# Patient Record
Sex: Female | Born: 1960 | Race: White | Hispanic: No | Smoking: Never smoker
Health system: Southern US, Community
[De-identification: ages and names within clinical notes are randomized; demographics above are authoritative.]

## PROBLEM LIST (undated history)

## (undated) DIAGNOSIS — K219 Gastro-esophageal reflux disease without esophagitis: Principal | ICD-10-CM

## (undated) DIAGNOSIS — F419 Anxiety disorder, unspecified: Secondary | ICD-10-CM

## (undated) DIAGNOSIS — F329 Major depressive disorder, single episode, unspecified: Secondary | ICD-10-CM

## (undated) DIAGNOSIS — L989 Disorder of the skin and subcutaneous tissue, unspecified: Secondary | ICD-10-CM

## (undated) DIAGNOSIS — K5790 Diverticulosis of intestine, part unspecified, without perforation or abscess without bleeding: Secondary | ICD-10-CM

## (undated) DIAGNOSIS — R197 Diarrhea, unspecified: Secondary | ICD-10-CM

## (undated) DIAGNOSIS — M1990 Unspecified osteoarthritis, unspecified site: Secondary | ICD-10-CM

## (undated) DIAGNOSIS — R030 Elevated blood-pressure reading, without diagnosis of hypertension: Secondary | ICD-10-CM

## (undated) DIAGNOSIS — F32A Depression, unspecified: Secondary | ICD-10-CM

## (undated) DIAGNOSIS — E663 Overweight: Secondary | ICD-10-CM

## (undated) DIAGNOSIS — F4329 Adjustment disorder with other symptoms: Secondary | ICD-10-CM

## (undated) DIAGNOSIS — E039 Hypothyroidism, unspecified: Principal | ICD-10-CM

## (undated) DIAGNOSIS — B019 Varicella without complication: Secondary | ICD-10-CM

## (undated) DIAGNOSIS — B354 Tinea corporis: Secondary | ICD-10-CM

## (undated) HISTORY — DX: Depression, unspecified: F32.A

## (undated) HISTORY — DX: Elevated blood-pressure reading, without diagnosis of hypertension: R03.0

## (undated) HISTORY — DX: Unspecified osteoarthritis, unspecified site: M19.90

## (undated) HISTORY — DX: Disorder of the skin and subcutaneous tissue, unspecified: L98.9

## (undated) HISTORY — DX: Hypothyroidism, unspecified: E03.9

## (undated) HISTORY — DX: Adjustment disorder with other symptoms: F43.29

## (undated) HISTORY — DX: Tinea corporis: B35.4

## (undated) HISTORY — DX: Gastro-esophageal reflux disease without esophagitis: K21.9

## (undated) HISTORY — DX: Varicella without complication: B01.9

## (undated) HISTORY — DX: Major depressive disorder, single episode, unspecified: F32.9

## (undated) HISTORY — DX: Anxiety disorder, unspecified: F41.9

## (undated) HISTORY — DX: Diverticulosis of intestine, part unspecified, without perforation or abscess without bleeding: K57.90

## (undated) HISTORY — DX: Diarrhea, unspecified: R19.7

## (undated) HISTORY — DX: Overweight: E66.3

---
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
## 1979-10-22 HISTORY — PX: OTHER SURGICAL HISTORY: SHX169

## 1985-10-21 HISTORY — PX: GANGLION CYST EXCISION: SHX1691

## 2000-02-13 ENCOUNTER — Other Ambulatory Visit: Admission: RE | Admit: 2000-02-13 | Discharge: 2000-02-13 | Payer: Self-pay | Admitting: Obstetrics & Gynecology

## 2000-10-21 HISTORY — PX: ABDOMINAL HYSTERECTOMY: SHX81

## 2001-02-09 ENCOUNTER — Encounter (INDEPENDENT_AMBULATORY_CARE_PROVIDER_SITE_OTHER): Payer: Self-pay | Admitting: Specialist

## 2001-02-09 ENCOUNTER — Inpatient Hospital Stay (HOSPITAL_COMMUNITY): Admission: RE | Admit: 2001-02-09 | Discharge: 2001-02-11 | Payer: Self-pay | Admitting: Obstetrics & Gynecology

## 2002-09-10 ENCOUNTER — Encounter: Payer: Self-pay | Admitting: Cardiology

## 2002-10-21 HISTORY — PX: BUNIONECTOMY: SHX129

## 2005-05-13 ENCOUNTER — Encounter: Admission: RE | Admit: 2005-05-13 | Discharge: 2005-05-13 | Payer: Self-pay | Admitting: Nurse Practitioner

## 2005-05-19 ENCOUNTER — Emergency Department (HOSPITAL_COMMUNITY): Admission: EM | Admit: 2005-05-19 | Discharge: 2005-05-19 | Payer: Self-pay | Admitting: Emergency Medicine

## 2009-10-11 ENCOUNTER — Encounter (INDEPENDENT_AMBULATORY_CARE_PROVIDER_SITE_OTHER): Payer: Self-pay | Admitting: *Deleted

## 2009-10-11 LAB — CONVERTED CEMR LAB
BUN: 14 mg/dL
CO2: 29 meq/L
Calcium: 9.4 mg/dL
Chloride: 103 meq/L
Cholesterol: 199 mg/dL
Glucose, Bld: 82 mg/dL
Potassium: 4.9 meq/L
Sodium: 19 meq/L

## 2010-01-05 ENCOUNTER — Encounter: Payer: Self-pay | Admitting: Family Medicine

## 2010-02-16 LAB — HM MAMMOGRAPHY: HM Mammogram: NEGATIVE

## 2010-09-26 ENCOUNTER — Ambulatory Visit: Payer: Self-pay | Admitting: Family Medicine

## 2010-09-26 ENCOUNTER — Telehealth: Payer: Self-pay | Admitting: Family Medicine

## 2010-09-26 DIAGNOSIS — F411 Generalized anxiety disorder: Secondary | ICD-10-CM | POA: Insufficient documentation

## 2010-09-26 DIAGNOSIS — M545 Low back pain, unspecified: Secondary | ICD-10-CM | POA: Insufficient documentation

## 2010-09-26 DIAGNOSIS — N920 Excessive and frequent menstruation with regular cycle: Secondary | ICD-10-CM | POA: Insufficient documentation

## 2010-09-26 DIAGNOSIS — M1990 Unspecified osteoarthritis, unspecified site: Secondary | ICD-10-CM | POA: Insufficient documentation

## 2010-09-28 ENCOUNTER — Telehealth: Payer: Self-pay | Admitting: Family Medicine

## 2010-10-03 ENCOUNTER — Ambulatory Visit: Payer: Self-pay | Admitting: Family Medicine

## 2010-10-03 LAB — CONVERTED CEMR LAB
ALT: 26 units/L (ref 0–35)
AST: 33 units/L (ref 0–37)
Alkaline Phosphatase: 48 units/L (ref 39–117)
Basophils Absolute: 0 10*3/uL (ref 0.0–0.1)
Basophils Relative: 0.7 % (ref 0.0–3.0)
Bilirubin Urine: NEGATIVE
Bilirubin, Direct: 0.1 mg/dL (ref 0.0–0.3)
Calcium: 9.5 mg/dL (ref 8.4–10.5)
Creatinine, Ser: 0.6 mg/dL (ref 0.4–1.2)
Eosinophils Relative: 2.3 % (ref 0.0–5.0)
Lymphocytes Relative: 32.3 % (ref 12.0–46.0)
Lymphs Abs: 1.5 10*3/uL (ref 0.7–4.0)
MCV: 95.1 fL (ref 78.0–100.0)
Monocytes Relative: 9.9 % (ref 3.0–12.0)
Neutro Abs: 2.6 10*3/uL (ref 1.4–7.7)
Nitrite: NEGATIVE
Potassium: 4.8 meq/L (ref 3.5–5.1)
Specific Gravity, Urine: 1.015 (ref 1.000–1.030)
Total Bilirubin: 0.5 mg/dL (ref 0.3–1.2)
Total Protein: 7 g/dL (ref 6.0–8.3)
Triglycerides: 95 mg/dL (ref 0.0–149.0)
Urine Glucose: NEGATIVE mg/dL
Urobilinogen, UA: 0.2 (ref 0.0–1.0)
WBC: 4.7 10*3/uL (ref 4.5–10.5)

## 2010-10-08 ENCOUNTER — Encounter (INDEPENDENT_AMBULATORY_CARE_PROVIDER_SITE_OTHER): Payer: Self-pay | Admitting: *Deleted

## 2010-11-14 ENCOUNTER — Encounter: Payer: Self-pay | Admitting: Family Medicine

## 2010-11-20 NOTE — Progress Notes (Signed)
  Phone Note Call from Patient   Call For: other MDs and additional FH Summary of Call: Patient called to report Her Dermatologist is Dr Lenard Forth of [facility], [address], [phone] Her Cardiologist is Dr Cassell Clement of [facility], [address] [place], [phone] Mother: HTN Father: Hyperlipidemia Initial call taken by: Danise Edge MD,  September 28, 2010 10:58 AM

## 2010-11-20 NOTE — Progress Notes (Signed)
Summary: Medical Record Request  Phone Note Outgoing Call   Call placed by: Lannette Donath,  September 26, 2010 5:09 PM Summary of Call: Faxed medical record request to [facility] [phone] Initial call taken by: Lannette Donath,  September 26, 2010 5:11 PM
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
## 2010-11-22 NOTE — Miscellaneous (Signed)
Summary: Lab and test results  Clinical Lists Changes  Observations: Added new observation of MAMMRECACT: Screening mammogram in 1 year.    (02/16/2010 15:22) Added new observation of MAMMOGRAM:  Assessment: Negative-BIRADS 1.  (02/16/2010 15:22) Added new observation of BILI TOTAL: 0.7 mg/dL (16/07/9603 54:09) Added new observation of SGOT (AST): 28 units/L (10/11/2009 15:07) Added new observation of SGPT (ALT): 21 units/L (10/11/2009 15:07) Added new observation of CALCIUM: 9.4 mg/dL (81/19/1478 29:56) Added new observation of CO2 PLSM/SER: 29 meq/L (10/11/2009 15:06) Added new observation of CL SERUM: 103 meq/L (10/11/2009 15:06) Added new observation of K SERUM: 4.9 meq/L (10/11/2009 15:06) Added new observation of NA: 19 meq/L (10/11/2009 15:06) Added new observation of CREATININE: 0.70 mg/dL (21/30/8657 84:69) Added new observation of BUN: 14 mg/dL (62/95/2841 32:44) Added new observation of BG RANDOM: 82 mg/dL (10/23/7251 66:44) Added new observation of TRIGLYCERIDE: 145 mg/dL (03/47/4259 56:38) Added new observation of LDL DIR: 88 mg/dL (75/64/3329 51:88) Added new observation of CHOLESTEROL: 199 mg/dL (41/66/0630 16:01) Added new observation of MAMMRECACT: Screening mammogram in 1 year.     (02/10/2009 15:11) Added new observation of MAMMOGRAM:  Assessment: Negative-BIRADS 1.   (02/10/2009 15:11) Added new observation of MAMMRECACT: Screening mammogram in 1 year.     (02/10/2008 15:19) Added new observation of MAMMOGRAM:  Assessment:Negative- BIRADS 1.   (02/10/2008 15:19) Added new observation of MAMMRECACT: Screening mammogram in 1 year.     (12/22/2006 15:20) Added new observation of MAMMOGRAM:  Assessment: Negative-BIRADS 1.   (12/22/2006 15:20) Added new observation of MAMMRECACT: Screening mammogram in 1 year.     (04/12/2005 15:20) Added new observation of MAMMOGRAM:  Assessment:Negative- BIRADS 1.   (04/12/2005 15:20) Added new observation of MAMMRECACT:  Screening mammogram in 1 year.     (04/02/2004 15:20) Added new observation of MAMMOGRAM:  Assessment:Negative- BIRADS 1.   (04/02/2004 15:20)      Mammogram  Procedure date:  02/10/2009  Findings:       Assessment: Negative-BIRADS 1.   Mammogram  Procedure date:  02/10/2009  Comments:      Screening mammogram in 1 year.     Mammogram  Procedure date:  02/10/2008  Findings:       Assessment:Negative- BIRADS 1.   Comments:      Screening mammogram in 1 year.     Mammogram  Procedure date:  12/22/2006  Findings:       Assessment: Negative-BIRADS 1.   Comments:      Screening mammogram in 1 year.     Mammogram  Procedure date:  04/12/2005  Findings:       Assessment:Negative- BIRADS 1.   Comments:      Screening mammogram in 1 year.     Mammogram  Procedure date:  04/02/2004  Findings:       Assessment:Negative- BIRADS 1.   Comments:      Screening mammogram in 1 year.     Mammogram  Procedure date:  02/16/2010  Findings:       Assessment: Negative-BIRADS 1.   Comments:      Screening mammogram in 1 year.     -  Date:  10/11/2009    LDL-direct: 88    Triglycerides: 145

## 2010-11-22 NOTE — Letter (Signed)
Summary: 2009-01/05/10 records  2009-01/05/10 records   Imported By: Lester Stewart 10/16/2010 08:13:36  _____________________________________________________________________  External Attachment:    Type:   Image     Comment:   External Document

## 2011-01-01 ENCOUNTER — Ambulatory Visit (INDEPENDENT_AMBULATORY_CARE_PROVIDER_SITE_OTHER): Payer: BC Managed Care – PPO | Admitting: Family Medicine

## 2011-01-01 ENCOUNTER — Encounter: Payer: Self-pay | Admitting: Family Medicine

## 2011-01-01 DIAGNOSIS — R3911 Hesitancy of micturition: Secondary | ICD-10-CM | POA: Insufficient documentation

## 2011-01-01 DIAGNOSIS — M25559 Pain in unspecified hip: Secondary | ICD-10-CM | POA: Insufficient documentation

## 2011-01-01 DIAGNOSIS — M545 Low back pain, unspecified: Secondary | ICD-10-CM

## 2011-01-02 ENCOUNTER — Ambulatory Visit (HOSPITAL_BASED_OUTPATIENT_CLINIC_OR_DEPARTMENT_OTHER)
Admission: RE | Admit: 2011-01-02 | Discharge: 2011-01-02 | Disposition: A | Discharge status: Home / Self Care | Payer: BC Managed Care – PPO | Source: Ambulatory Visit | Attending: Family Medicine | Admitting: Family Medicine

## 2011-01-02 ENCOUNTER — Encounter: Payer: Self-pay | Admitting: Family Medicine

## 2011-01-02 ENCOUNTER — Other Ambulatory Visit: Payer: Self-pay | Admitting: Family Medicine

## 2011-01-02 DIAGNOSIS — M1990 Unspecified osteoarthritis, unspecified site: Secondary | ICD-10-CM

## 2011-01-02 DIAGNOSIS — M169 Osteoarthritis of hip, unspecified: Secondary | ICD-10-CM | POA: Insufficient documentation

## 2011-01-02 DIAGNOSIS — M545 Low back pain, unspecified: Secondary | ICD-10-CM | POA: Insufficient documentation

## 2011-01-02 DIAGNOSIS — M1610 Unilateral primary osteoarthritis, unspecified hip: Secondary | ICD-10-CM | POA: Insufficient documentation

## 2011-01-03 ENCOUNTER — Telehealth (INDEPENDENT_AMBULATORY_CARE_PROVIDER_SITE_OTHER): Payer: Self-pay | Admitting: *Deleted

## 2011-01-08 NOTE — Progress Notes (Signed)
Summary: Urine test results  Phone Note Call from Patient Call back at [phone]   Caller: Patient Reason for Call: Talk to Nurse Summary of Call: pt checking on urine results, best time to reach her will be after lunch on Friday 01/04/11 Initial call taken by: Lannette Donath,  January 03, 2011 4:14 PM  Follow-up for Phone Call        So for some reason they reinculbated her urine culture instead of resulting it, I guess it did not culture correctly the first time. Please check with lab to see if we might expect some results today or not. THe patient is not available to talk until afternoon, so when we do talk to her see what her symptoms are because we may just have to treat her empirically since we are approaching the weekend Follow-up by: Danise Edge MD,  January 04, 2011 9:09 AM  Additional Follow-up for Phone Call Additional follow up Details #1::        Loney Loh states the preliminary comes out usually 24 hrs after receiving specimen and the final is usually 48 hrs after receiving specimen. So it should be back sometime today. Additional Follow-up by: Josph Macho RMA,  January 04, 2011 10:30 AM    Additional Follow-up for Phone Call Additional follow up Details #2::    will inform pt with other note Follow-up by: Josph Macho RMA,  January 04, 2011 10:34 AM
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
## 2011-03-08 NOTE — Discharge Summary (Signed)
[facility]  Patient:    Kristen Cooke, Kristen Cooke                       MRN: [id number] Adm. Date:  [id number] Disc. Date: 02/11/01 Attending:  Lars Pinks                           Discharge Summary  FINAL DIAGNOSES:              Menorrhagia, dysmenorrhea.  SECONDARY DIAGNOSES:          None.  PROCEDURE:                    Diagnostic laparoscopy, transvaginal hysterectomy.  COMPLICATIONS:                None.  CONDITION ON DISCHARGE:       Improved.                                This is a 50 year old nulligravida female who is complaining of heavy menstrual periods for approximately four years.  The patient has been tried on oral contraceptives with limited success and she complains of even on oral contraceptives her periods are heavy and prolonged. She had a sonohysterogram done that showed no evidence of submucous myoma or polyps.  It did show some thickened irregularity of the myometrium which might have represented adenomyosis.  In view of the failure of conservative therapy to control this patients menorrhagia which has caused her problems with work and social functioning, the decision was made to proceed with hysterectomy. The patient was brought to the operating room on the day of admission where diagnostic laparoscopy was performed to rule out endometriosis or significant pelvic adhesions.  There were some filmy adhesions around the right ovary to the posterior fundus.  Otherwise, the pelvis appeared normal.  There was also a small area of what could be endometriosis on the right uterosacral ligament. Following the diagnostic laparoscopy and lysis of adhesions, the transvaginal hysterectomy was then carried out without complication.  The patients postoperative course was benign without significant fever or anemia. Postoperative day #1 her hemoglobin was 11.3, white count 11,000.  She was doing well.  The patient did not feel ready for  discharge on the first postoperative day.  On the second postoperative day the patient was feeling somewhat stronger.  She was voiding without difficulty, passing gas, and eating well.  Her pain was well controlled with oral analgesia.  The patient was therefore felt to be ready for discharge.  She was discharged on a regular diet, told to limit her activity.  She was given Tylox #30 tablets to take one q.4h. p.r.n. pain.  She was given Restoril 30 mg to take at bedtime to help with sleep.  Told to get over-the-counter iron preparation.  She was also asked to call the office in three weeks for followup evaluation.  LABORATORIES:                 Hemoglobin on admission was 13.9, white count 6300.  Postoperative hemoglobin was 11 with a white count of 11,000.  Routine chemistries done preoperative were all normal.  Urine pregnancy test was negative.  Urinalysis was benign.  PATHOLOGY:  Pending at the time of this dictation. DD:  02/11/01 TD:  02/11/01 Job: [id number]

## 2011-03-08 NOTE — H&P (Signed)
[facility]  Patient:    Kristen Cooke, Kristen Cooke                         MRN: [id number] Adm. Date:  02/09/01 Attending:  Caralyn Guile. Arlyce Dice, M.D.                         History and Physical  CHIEF COMPLAINT:              Menorrhagia and dysmenorrhea.  HISTORY OF PRESENT ILLNESS:   This is a 50 year old nulligravida female who has had heavy menstrual periods for approximately four years.  The patient has been tried on oral contraceptives with only limited success, and she complains that even on oral contraceptives, her periods are heavy and prolonged.  The patient had a sonohistogram done, and this showed no evidence of submucous myoma or polyps.  The options of endometrial ablation or total vaginal hysterectomy were discussed with the patient, and the patient opted for transvaginal hysterectomy.  PAST MEDICAL HISTORY:         The patient has had previous surgery which included repair of a broken leg which required a pin insertion and removal. She has no medical problems.  ALLERGIES:                    SULFA.  MEDICATIONS:                  Zoloft taken for premenstrual syndrome.  FAMILY HISTORY:               She has an aunt with diabetes.  She has a maternal grandmother who developed breast cancer in her 63s.  Otherwise, her family history is negative for heart disease or high blood pressure.  REVIEW OF SYSTEMS:            Negative in detail.  PHYSICAL EXAMINATION:  VITAL SIGNS:                  She is 5 feet 3-1/2 inches tall, 125 pounds.  Bp 110/60, and her other vital signs are normal.  NECK:                         Supple without thyromegaly.  BREASTS:                      Without masses.  HEART:                        Without murmur or gallop.  CHEST:                        Clear to auscultation and percussion.  ABDOMEN:                      Soft without tenderness.  EXTREMITIES:                  Appear normal, and she has full range of  motion in the leg on which she had surgery.  LYMPHADENOPATHY:              None noted.  GU:                           External genitalia were normal,  vagina was normal.  Her cervix appeared clean and normal.  Her uterus was mobile, anterior, normal size, shape, and contour.  There were no adnexal masses.  IMPRESSION:                   This patient is suffering from dysmenorrhea and menorrhagia which has not responded to oral contraceptives or the nonsteroidal anti-inflammatory drugs.  The patient considered endometrial ablation and has opted for a transvaginal hysterectomy.  The risks of this procedure were discussed with the patient prior to proceeding.  HOSPITAL PLAN:                Because the patient is nulliparous and because of the dysmenorrhea, a diagnostic laparoscopy will be performed prior to surgery to ensure that there are no adhesions and no significant endometriosis.  Following this, a transvaginal hysterectomy will be performed. D:  02/08/01 TD:  02/08/01 Job: [id number]

## 2011-03-28 ENCOUNTER — Other Ambulatory Visit: Payer: Self-pay | Admitting: Family Medicine

## 2011-04-05 ENCOUNTER — Ambulatory Visit (INDEPENDENT_AMBULATORY_CARE_PROVIDER_SITE_OTHER): Payer: BC Managed Care – PPO | Admitting: Family Medicine

## 2011-04-05 ENCOUNTER — Encounter: Payer: Self-pay | Admitting: Family Medicine

## 2011-04-05 VITALS — BP 130/84 | HR 68 | Temp 98.4°F | Ht 64.25 in | Wt 141.8 lb

## 2011-04-05 DIAGNOSIS — L0291 Cutaneous abscess, unspecified: Secondary | ICD-10-CM

## 2011-04-05 DIAGNOSIS — S93409A Sprain of unspecified ligament of unspecified ankle, initial encounter: Secondary | ICD-10-CM

## 2011-04-05 DIAGNOSIS — S93401A Sprain of unspecified ligament of right ankle, initial encounter: Secondary | ICD-10-CM | POA: Insufficient documentation

## 2011-04-05 DIAGNOSIS — L0390 Cellulitis, unspecified: Secondary | ICD-10-CM

## 2011-04-05 MED ORDER — DOXYCYCLINE HYCLATE 100 MG PO TABS: 100.0000 mg | ORAL_TABLET | Freq: Two times a day (BID) | ORAL | Status: AC

## 2011-04-05 NOTE — Assessment & Plan Note (Signed)
Patient notes pain over a lesion over her central lumbar spine, she thinks she got it yesterday while working outside but is being seen today because it is now painful. Lesion is ecchymotic and serythematous surrounding a punctate scab. Suspect early infection of a bug bite, will rx Doxycycline 100mg  po bid x 7 days and patient will cleanse with [facility] as directed. Call with any concerns

## 2011-04-05 NOTE — Assessment & Plan Note (Signed)
Patient suffered an inversion injury to her right ankle about a month ago and while it is improved it does continue to hurt some with mild swelling over the lateral malleolus. She is instructed to ice the lesion and then apply Aspercreme bid and then to perform some exercises as instructed, call if symptoms worsen

## 2011-04-05 NOTE — Patient Instructions (Signed)
Cellulitis Cellulitis is an infection of the skin and the tissue beneath it. The area is typically red and tender. It is caused by germs (bacteria) (usually staph or strep) that enter the body through cuts or sores. Cellulitis most commonly occurs in the arms or lower legs.  HOME CARE INSTRUCTIONS  If you are given a prescription for medications which kill germs (antibiotics), take as directed until finished.   If the infection is on the arm or leg, keep the limb elevated as able.   Use a warm cloth several times per day to relieve pain and encourage healing.   See your caregiver for recheck of the infected site in  or sooner if problems arise.   Only take over-the-counter or prescription medicines for pain, discomfort, or fever as directed by your caregiver.  SEEK MEDICAL CARE IF:  An oral temperature above 104 develops, not controlled by medication.   The area of redness (inflammation) is spreading, there are red streaks coming from the infected site, or if a part of the infection begins to turn dark in color.   The joint or bone underneath the infected skin becomes painful after the skin has healed.   The infection returns in the same or another area after it seems to have gone away.   A boil or bump swells up. This may be an abscess.   New, unexplained problems such as pain or fever develop.  SEEK IMMEDIATE MEDICAL CARE IF:  You or your child feels drowsy or lethargic.   There is vomiting, diarrhea, or lasting discomfort or feeling ill (malaise) with muscle aches and pains.  MAKE SURE YOU:   Understand these instructions.   Will watch your condition.   Will get help right away if you are not doing well or get worse.  Document Released: 07/17/2005 Document Re-Released: 08/04/2009 [facility] Patient Information 2011 [place], [place].  Remember to cleanse area twice daily with alcohol, hydrogen peroxide and/or witch hazel peroxide  For the ankle ice twice daily, apply  Aspercreme twice daily as needed and do the ABCs

## 2011-04-05 NOTE — Progress Notes (Signed)
Kristen Cooke [id number] [birth year] 04/05/2011      Progress Note-Follow Up  Subjective  Chief Complaint  Chief Complaint  Patient presents with  . Insect Bite    X 1 day- on back    HPI  Patient is in today for evaluation of a sore on her lower back, she was working outside yesterday and thinks she suffered a bite on her lower back, she did not see an insect. This morning the lesion is bothering her some with some pain developing so she is in to have it evaluated. No fevers/chills/malaise/myalgias/nausea/anorexia/GI or GU c/o. She has not taken any meds for the condition. She also notes about a month ago she suffered an inversion injury to her right ankle and has been struggling with some discomfort ever since, it is improving slowly. The swelling and tenderness surrounds the lateral malleolus. No numbness or weakness.   Past Medical History  Diagnosis Date  . Chicken pox   . Depression   . Anxiety   . Arthritis     back    Past Surgical History  Procedure Date  . Abdominal hysterectomy 2002    vaginal, partial  . Tonsillectomy 1967  . Titanium rod insertion 1978    right femur  . Titanium rod removal 1980    bone graph, re-insertion  . Titanium rod removal 1981  . Ganglion cyst excision 1987    right wrist  . Bunionectomy 2004    b/l    Family History  Problem Relation Age of Onset  . Hypertension Father   . Hyperlipidemia Father   . Coronary artery disease Father   . Hypertension Mother   . Osteoarthritis Mother   . Osteoporosis Mother   . Scoliosis Mother   . Obesity Sister   . Hypothyroidism Sister   . Cancer Maternal Grandmother     Breast  . Pneumonia Maternal Grandmother   . Cancer Paternal Grandmother   . Cancer Paternal Grandfather     colon    History   Social History  . Marital Status: Married    Spouse Name: N/A    Number of Children: N/A  . Years of Education: N/A   Occupational History  . Not on file.   Social History Main Topics    . Smoking status: Never Smoker   . Smokeless tobacco: Never Used  . Alcohol Use: 0.0 oz/week    7-14 drink(s) per week     1-2 daily  . Drug Use: No  . Sexually Active: Yes -- Female partner(s)   Other Topics Concern  . Not on file   Social History Narrative  . No narrative on file    Current Outpatient Prescriptions on File Prior to Visit  Medication Sig Dispense Refill  . sertraline (ZOLOFT) 50 MG tablet TAKE 1 TABLET BY MOUTH EVERY DAY  30 tablet  5  . DISCONTD: celecoxib (CELEBREX) 200 MG capsule Take 200 mg by mouth 2 (two) times daily as needed.        Marland Kitchen DISCONTD: cyclobenzaprine (FLEXERIL) 10 MG tablet Take 10 mg by mouth 3 (three) times daily as needed. 1/2 to 1 tab po tid prn for pain. May cause sedation       . DISCONTD: lidocaine (LIDODERM) 5 % Place 1 patch onto the skin as directed. 1 patch topically in affected area, off after 12 hours and 12 hour break before applying another patch, as needed pain         Allergies  Allergen  Reactions  . Amoxicillin   . Sulfonamide Derivatives     Review of Systems  Review of Systems  Constitutional: Negative for fever, chills and malaise/fatigue.  HENT: Negative for congestion.   Eyes: Negative for discharge.  Respiratory: Negative for shortness of breath.   Cardiovascular: Negative for chest pain, palpitations and leg swelling.  Gastrointestinal: Negative for nausea, abdominal pain and diarrhea.  Genitourinary: Negative for dysuria.  Musculoskeletal: Positive for joint pain. Negative for falls.       Right ankle pain and swelling x 1 month, s/p twisting  Skin: Negative for rash.       Lesion over lumbar spine, tender x 24 hours  Neurological: Negative for loss of consciousness, weakness and headaches.  Endo/Heme/Allergies: Negative for polydipsia.  Psychiatric/Behavioral: Negative for depression and suicidal ideas. The patient is not nervous/anxious and does not have insomnia.     Objective  BP 130/84  Pulse 68   Temp(Src) 98.4 F (36.9 C) (Oral)  Ht 5' 4.25" (1.632 m)  Wt 141 lb 12.8 oz (64.32 kg)  BMI 24.15 kg/m2  SpO2 98%  Physical Exam  Physical Exam  Constitutional: She is oriented to person, place, and time and well-developed, well-nourished, and in no distress. No distress.  HENT:  Head: Normocephalic and atraumatic.  Eyes: Conjunctivae are normal.  Neck: Neck supple. No thyromegaly present.  Cardiovascular: Normal rate, regular rhythm and normal heart sounds.   No murmur heard. Pulmonary/Chest: Effort normal and breath sounds normal. She has no wheezes.  Abdominal: She exhibits no distension and no mass.  Musculoskeletal: Normal range of motion. She exhibits edema and tenderness.       Very slight swelling nad tenderness surrounding lateral malleolus on right  Lymphadenopathy:    She has no cervical adenopathy.  Neurological: She is alert and oriented to person, place, and time.  Skin: Skin is warm and dry. No rash noted. She is not diaphoretic.       Small 2-3 mm scab over lumbar spine with roughly 1 inch of surrounding erythema and ecchymosis.  Psychiatric: Memory, affect and judgment normal.    Lab Results  Component Value Date   TSH 3.53 10/03/2010   Lab Results  Component Value Date   WBC 4.7 10/03/2010   HGB 13.3 10/03/2010   HCT 38.3 10/03/2010   MCV 95.1 10/03/2010   PLT 211.0 10/03/2010   Lab Results  Component Value Date   CREATININE 0.6 10/03/2010   BUN 14 10/03/2010   NA 139 10/03/2010   K 4.8 10/03/2010   CL 103 10/03/2010   CO2 28 10/03/2010   Lab Results  Component Value Date   ALT 26 10/03/2010   AST 33 10/03/2010   ALKPHOS 48 10/03/2010   BILITOT 0.5 10/03/2010   Lab Results  Component Value Date   CHOL 200 10/03/2010   Lab Results  Component Value Date   HDL 83.40 10/03/2010   Lab Results  Component Value Date   LDLCALC 98 10/03/2010   Lab Results  Component Value Date   TRIG 95.0 10/03/2010   Lab Results  Component Value Date    CHOLHDL 2 10/03/2010     Assessment & Plan  Cellulitis Patient notes pain over a lesion over her central lumbar spine, she thinks she got it yesterday while working outside but is being seen today because it is now painful. Lesion is ecchymotic and serythematous surrounding a punctate scab. Suspect early infection of a bug bite, will rx Doxycycline 100mg  po bid x  7 days and patient will cleanse with [facility] as directed. Call with any concerns  Right ankle sprain Patient suffered an inversion injury to her right ankle about a month ago and while it is improved it does continue to hurt some with mild swelling over the lateral malleolus. She is instructed to ice the lesion and then apply Aspercreme bid and then to perform some exercises as instructed, call if symptoms worsen

## 2011-08-29 ENCOUNTER — Telehealth: Payer: Self-pay | Admitting: Family Medicine

## 2011-08-29 DIAGNOSIS — Z1211 Encounter for screening for malignant neoplasm of colon: Secondary | ICD-10-CM

## 2011-08-29 NOTE — Telephone Encounter (Signed)
Please advise 

## 2011-08-30 ENCOUNTER — Encounter: Payer: Self-pay | Admitting: Gastroenterology

## 2011-09-18 ENCOUNTER — Encounter: Payer: Self-pay | Admitting: Gastroenterology

## 2011-09-18 ENCOUNTER — Ambulatory Visit (AMBULATORY_SURGERY_CENTER): Payer: BC Managed Care – PPO | Admitting: *Deleted

## 2011-09-18 VITALS — Ht 63.0 in | Wt 141.3 lb

## 2011-09-18 DIAGNOSIS — Z1211 Encounter for screening for malignant neoplasm of colon: Secondary | ICD-10-CM

## 2011-09-18 MED ORDER — MOVIPREP 100 G PO SOLR: ORAL | Status: DC

## 2011-09-22 ENCOUNTER — Other Ambulatory Visit: Payer: Self-pay | Admitting: Family Medicine

## 2011-09-25 ENCOUNTER — Telehealth: Payer: Self-pay | Admitting: Cardiology

## 2011-09-25 NOTE — Telephone Encounter (Signed)
I don't recognize this patient's name.

## 2011-09-25 NOTE — Telephone Encounter (Signed)
New Msg: Pt calling asking that attending physician statement for pt life insurance pt faxed back to number on paperwork. Please return pt call to discuss further if necessary.

## 2011-09-26 NOTE — Telephone Encounter (Signed)
Left message

## 2011-10-01 NOTE — Telephone Encounter (Signed)
Spoke with Pt, she is Public affairs consultant Records ( stated it was a 1 time visit) Chart is in Warehouse, Once Chart is Pulled from Warehouse I will copy Records and call pt for Pick up/ she also Needs to sign Release Of Information.10/01/11/km

## 2011-10-02 ENCOUNTER — Encounter: Payer: Self-pay | Admitting: Gastroenterology

## 2011-10-02 ENCOUNTER — Ambulatory Visit (AMBULATORY_SURGERY_CENTER): Payer: BC Managed Care – PPO | Admitting: Gastroenterology

## 2011-10-02 VITALS — BP 133/88 | HR 74 | Temp 98.4°F | Resp 18 | Ht 63.0 in | Wt 141.0 lb

## 2011-10-02 DIAGNOSIS — Z1211 Encounter for screening for malignant neoplasm of colon: Secondary | ICD-10-CM

## 2011-10-02 MED ORDER — SODIUM CHLORIDE 0.9 % IV SOLN: 500.0000 mL | INTRAVENOUS | Status: DC

## 2011-10-02 NOTE — Patient Instructions (Signed)
Discharge instructions given with verbal understanding. Handouts on diverticulosis and a high fiber diet given. Resume previous medications. 

## 2011-10-02 NOTE — Progress Notes (Signed)
Patient did not experience any of the following events: a burn prior to discharge; a fall within the facility; wrong site/side/patient/procedure/implant event; or a hospital transfer or hospital admission upon discharge from the facility. (G8907) Patient did not have preoperative order for IV antibiotic SSI prophylaxis. (G8918)  

## 2011-10-02 NOTE — Op Note (Signed)
[facility] [address] [place], [place]  [address]  COLONOSCOPY PROCEDURE REPORT  PATIENT:  Kristen, Cooke  MR#:  [id number] BIRTHDATE:  [birth year], 50 yrs. old  GENDER:  female ENDOSCOPIST:  Judie Petit T. Russella Dar, MD, [facility] Referred by:  Reuel Derby, M.D. PROCEDURE DATE:  10/02/2011 PROCEDURE:  Colonoscopy 19147 ASA CLASS:  Class II INDICATIONS:  1) Routine Risk Screening MEDICATIONS:   These medications were titrated to patient response per physician's verbal order, Fentanyl 125 mcg IV, Versed 12 mg IV DESCRIPTION OF PROCEDURE:   After the risks benefits and alternatives of the procedure were thoroughly explained, informed consent was obtained.  Digital rectal exam was performed and revealed no abnormalities.   The LB [id number] endoscope was introduced through the anus and advanced to the cecum, which was identified by both the appendix and ileocecal valve, without limitations.  The quality of the prep was excellent, using MoviPrep.  The instrument was then slowly withdrawn as the colon was fully examined. <<PROCEDUREIMAGES>> FINDINGS:  Scattered diverticula were found in the sigmoid colon. Otherwise normal colonoscopy without other polyps, masses, vascular ectasias, or inflammatory changes. Retroflexed views in the rectum revealed no abnormalities. The time to cecum =  3.5  minutes. The scope was then withdrawn (time =  9.25  min) from the patient and the procedure completed.  COMPLICATIONS:  None  ENDOSCOPIC IMPRESSION: 1) Diverticula, scattered in the sigmoid colon  RECOMMENDATIONS: 1) High fiber diet with liberal fluid intake. 2) Continue current colorectal screening for "routine risk" patients with a repeat colonoscopy in 10 years.  Kristen Lick. Russella Dar, MD, Kristen Cooke  n. eSIGNED:   Venita Lick. Shaana Cooke at 10/02/2011 10:15 AM  Joana Reamer, [phone]

## 2011-10-03 ENCOUNTER — Telehealth: Payer: Self-pay

## 2011-10-03 NOTE — Telephone Encounter (Signed)
Left message on answering machine. 

## 2011-10-04 NOTE — Telephone Encounter (Addendum)
Called Pt this Am, she will come by today and pick up Records & Sign Release  10/04/11/km  Pt Picked up Records 10/04/11/km

## 2011-10-22 LAB — HM PAP SMEAR

## 2012-01-13 ENCOUNTER — Ambulatory Visit (INDEPENDENT_AMBULATORY_CARE_PROVIDER_SITE_OTHER): Payer: BC Managed Care – PPO | Admitting: Family Medicine

## 2012-01-13 ENCOUNTER — Encounter: Payer: Self-pay | Admitting: Family Medicine

## 2012-01-13 VITALS — BP 117/74 | HR 83 | Temp 97.9°F | Ht 64.25 in | Wt 142.0 lb

## 2012-01-13 DIAGNOSIS — F329 Major depressive disorder, single episode, unspecified: Secondary | ICD-10-CM

## 2012-01-13 DIAGNOSIS — IMO0001 Reserved for inherently not codable concepts without codable children: Secondary | ICD-10-CM

## 2012-01-13 DIAGNOSIS — R197 Diarrhea, unspecified: Secondary | ICD-10-CM

## 2012-01-13 DIAGNOSIS — K219 Gastro-esophageal reflux disease without esophagitis: Secondary | ICD-10-CM

## 2012-01-13 DIAGNOSIS — R1013 Epigastric pain: Secondary | ICD-10-CM

## 2012-01-13 DIAGNOSIS — F418 Other specified anxiety disorders: Secondary | ICD-10-CM

## 2012-01-13 DIAGNOSIS — F4329 Adjustment disorder with other symptoms: Secondary | ICD-10-CM

## 2012-01-13 DIAGNOSIS — F341 Dysthymic disorder: Secondary | ICD-10-CM

## 2012-01-13 LAB — CBC
HCT: 43 % (ref 36.0–46.0)
MCHC: 32.9 g/dL (ref 30.0–36.0)
RDW: 13.1 % (ref 11.5–14.6)
WBC: 8.2 10*3/uL (ref 4.5–10.5)

## 2012-01-13 LAB — HEPATIC FUNCTION PANEL
ALT: 48 U/L — ABNORMAL HIGH (ref 0–35)
AST: 53 U/L — ABNORMAL HIGH (ref 0–37)
Alkaline Phosphatase: 65 U/L (ref 39–117)
Total Bilirubin: 0.3 mg/dL (ref 0.3–1.2)

## 2012-01-13 LAB — RENAL FUNCTION PANEL
Albumin: 4.4 g/dL (ref 3.5–5.2)
CO2: 27 mEq/L (ref 19–32)
GFR: 64.85 mL/min (ref >60.00)
Sodium: 138 mEq/L (ref 135–145)

## 2012-01-13 LAB — TSH: TSH: 3.52 u[IU]/mL (ref 0.35–5.50)

## 2012-01-13 MED ORDER — SERTRALINE HCL 100 MG PO TABS: 100.0000 mg | ORAL_TABLET | Freq: Every day | ORAL | Status: DC

## 2012-01-13 MED ORDER — RANITIDINE HCL 300 MG PO TABS
300.0000 mg | ORAL_TABLET | Freq: Every day | ORAL | Status: DC
Start: 2012-01-13 — End: 2012-06-15

## 2012-01-13 MED ORDER — ALPRAZOLAM 0.25 MG PO TABS: ORAL_TABLET | ORAL | Status: DC

## 2012-01-13 NOTE — Patient Instructions (Signed)
Grief Reaction  Grief is a normal response to the death of someone close to you. Feelings of fear, anger, and guilt can affect almost everyone who loses someone they love. Symptoms of depression are also common. These include problems with sleep, loss of appetite, and lack of energy. These grief reaction symptoms often last for weeks to months after a loss. They may also return during special times that remind you of the person you lost, such as an anniversary or birthday.  Anxiety, insomnia, irritability, and deep depression may last beyond the period of normal grief. If you experience these feelings for 6 months or longer, you may have clinical depression. Clinical depression requires further medical attention. If you think that you have clinical depression, you should contact your caregiver. If you have a history of depression and or a family history of depression, you are at greater risk of clinical depression. You are also at greater risk of developing clinical depression if the loss was traumatic or the loss was of someone with whom you had unresolved issues.    A grief reaction can become complicated by being blocked. This means being unable to cry or express extreme emotions. This may prolong the grieving period and worsen the emotional effects of the loss. Mourning is a natural event in human life. A healthy grief reaction is one that is not blocked . It requires a time of sadness and readjustment.It is very important to share your sorrow and fear with others, especially close friends and family. Professional counselors and clergy can also help you process your grief.  Document Released: 10/07/2005 Document Revised: 09/26/2011 Document Reviewed: 06/17/2006  [employer] Patient Information 2012 [employer].

## 2012-01-15 ENCOUNTER — Ambulatory Visit (HOSPITAL_BASED_OUTPATIENT_CLINIC_OR_DEPARTMENT_OTHER)
Admission: RE | Admit: 2012-01-15 | Discharge: 2012-01-15 | Disposition: A | Discharge status: Home / Self Care | Payer: BC Managed Care – PPO | Source: Ambulatory Visit | Attending: Family Medicine | Admitting: Family Medicine

## 2012-01-15 DIAGNOSIS — R110 Nausea: Secondary | ICD-10-CM

## 2012-01-15 DIAGNOSIS — R197 Diarrhea, unspecified: Secondary | ICD-10-CM

## 2012-01-15 DIAGNOSIS — R1013 Epigastric pain: Secondary | ICD-10-CM

## 2012-01-21 ENCOUNTER — Telehealth: Payer: Self-pay

## 2012-01-21 ENCOUNTER — Encounter: Payer: Self-pay | Admitting: Family Medicine

## 2012-01-21 DIAGNOSIS — F329 Major depressive disorder, single episode, unspecified: Secondary | ICD-10-CM | POA: Insufficient documentation

## 2012-01-21 DIAGNOSIS — R197 Diarrhea, unspecified: Secondary | ICD-10-CM

## 2012-01-21 DIAGNOSIS — F419 Anxiety disorder, unspecified: Secondary | ICD-10-CM

## 2012-01-21 DIAGNOSIS — F32A Depression, unspecified: Secondary | ICD-10-CM | POA: Insufficient documentation

## 2012-01-21 DIAGNOSIS — F4329 Adjustment disorder with other symptoms: Secondary | ICD-10-CM

## 2012-01-21 DIAGNOSIS — IMO0001 Reserved for inherently not codable concepts without codable children: Secondary | ICD-10-CM | POA: Insufficient documentation

## 2012-01-21 HISTORY — DX: Diarrhea, unspecified: R19.7

## 2012-01-21 HISTORY — DX: Depression, unspecified: F32.A

## 2012-01-21 HISTORY — DX: Reserved for inherently not codable concepts without codable children: IMO0001

## 2012-01-21 HISTORY — DX: Anxiety disorder, unspecified: F41.9

## 2012-01-21 HISTORY — DX: Adjustment disorder with other symptoms: F43.29

## 2012-01-21 NOTE — Telephone Encounter (Signed)
Patient left a message stating that her stomach is still bothering her? Pt stated her stomach "is just not right"? Pt also stated that she quit taking her probiotics because she believes it was giving her a fever? Pt stated that last week her fever got to 102? Please advise if there is anything else the patient can do?

## 2012-01-21 NOTE — Telephone Encounter (Signed)
So if she is still having fevers she should come back in for evaluation. Can try the prescription probiotic Lactinex 1 tab po tid, disp #90, 1 rf

## 2012-01-21 NOTE — Assessment & Plan Note (Signed)
Needs clear liquids for 24 hours then bland/brat diet and then progress as tolerated, needs to start a probioitc and report if symptoms do not resolve.

## 2012-01-21 NOTE — Progress Notes (Signed)
Patient ID: Kristen Cooke, female   DOB: [birth year], 51 y.o.   MRN: [id number] ALONZO LOVING [id number] 1961/05/18 01/21/2012      Progress Note-Follow Up  Subjective  Chief Complaint  Chief Complaint  Patient presents with  . GI Problem    diarrhea, BM after eating    HPI  Patient is a 51 yo caucasian female in today for evaluation of diarrhea. She has been struggling with loose stool for roughly 2 weeks now. No bloody, tarry or mucusy stool. Abdominal cramping is noted intermittently. Anorexia is present but no vomiting, cp, palp, sob. She begins to cry during the interview. Her husband died suddenly in 06/11/2023 by choking on a sandwich in their home. She has been struggling with her grief ever since, she has been drinking more alcohol and having trouble sleeping. She feels she has had some low grade fevers, malaise and myalgias recently as well.  Past Medical History  Diagnosis Date  . Chicken pox   . Depression   . Anxiety   . Arthritis     back  . Diarrhea 01/21/2012  . 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 Abdominal hysterectomy 2002    vaginal, partial  . Tonsillectomy 1967  . Titanium rod insertion 1978    right femur  . Titanium rod removal 1980    bone graph, re-insertion  . Titanium rod removal 1981  . Ganglion cyst excision 1987    right wrist  . Bunionectomy 2004    b/l    Family History  Problem Relation Age of Onset  . Hypertension Father   . Hyperlipidemia Father   . Coronary artery disease Father   . Hypertension Mother   . Osteoarthritis Mother   . Osteoporosis Mother   . Scoliosis Mother   . Irritable bowel syndrome Mother   . Obesity Sister   . Hypothyroidism Sister   . Cancer Maternal Grandmother     Breast  . Pneumonia Maternal Grandmother   . Cancer Paternal Grandmother   . Cancer Paternal Grandfather     colon  . Colon cancer Paternal Grandfather     History   Social History  . Marital Status:  Widowed    Spouse Name: N/A    Number of Children: N/A  . Years of Education: N/A   Occupational History  . Not on file.   Social History Main Topics  . Smoking status: Never Smoker   . Smokeless tobacco: Never Used  . Alcohol Use: 0.0 oz/week    7-14 drink(s) per week     1-2 daily  . Drug Use: No  . Sexually Active: Yes -- Female partner(s)   Other Topics Concern  . Not on file   Social History Narrative  . No narrative on file    Current Outpatient Prescriptions on File Prior to Visit  Medication Sig Dispense Refill  . Naproxen (NAPROSYN PO) Take by mouth as needed.        . NON FORMULARY Take 10 mg by mouth as needed. allerclear         Allergies  Allergen Reactions  . Amoxicillin   . Sulfonamide Derivatives     Review of Systems  Review of Systems  Constitutional: Negative for fever and malaise/fatigue.  HENT: Negative for congestion.   Eyes: Negative for discharge.  Respiratory: Negative for shortness of breath.   Cardiovascular: Negative for chest pain, palpitations and leg swelling.  Gastrointestinal: Positive for  abdominal pain and diarrhea. Negative for nausea, constipation, blood in stool and melena.  Genitourinary: Negative for dysuria.  Musculoskeletal: Negative for falls.  Skin: Negative for rash.  Neurological: Negative for loss of consciousness and headaches.  Endo/Heme/Allergies: Negative for polydipsia.  Psychiatric/Behavioral: Positive for depression. Negative for suicidal ideas. The patient is nervous/anxious and has insomnia.     Objective  BP 117/74  Pulse 83  Temp(Src) 97.9 F (36.6 C) (Temporal)  Ht 5' 4.25" (1.632 m)  Wt 142 lb (64.411 kg)  BMI 24.19 kg/m2  Physical Exam  Physical Exam  Constitutional: She is oriented to person, place, and time and well-developed, well-nourished, and in no distress. No distress.  HENT:  Head: Normocephalic and atraumatic.  Eyes: Conjunctivae are normal.  Neck: Neck supple. No thyromegaly  present.  Cardiovascular: Normal rate, regular rhythm and normal heart sounds.   No murmur heard. Pulmonary/Chest: Effort normal and breath sounds normal. She has no wheezes.  Abdominal: She exhibits no distension and no mass.  Musculoskeletal: She exhibits no edema.  Lymphadenopathy:    She has no cervical adenopathy.  Neurological: She is alert and oriented to person, place, and time.  Skin: Skin is warm and dry. No rash noted. She is not diaphoretic.  Psychiatric: Memory, affect and judgment normal.    Lab Results  Component Value Date   TSH 3.52 01/13/2012   Lab Results  Component Value Date   WBC 8.2 01/13/2012   HGB 14.1 01/13/2012   HCT 43.0 01/13/2012   MCV 96.2 01/13/2012   PLT 240.0 01/13/2012   Lab Results  Component Value Date   CREATININE 1.0 01/13/2012   BUN 20 01/13/2012   NA 138 01/13/2012   K 5.2* 01/13/2012   CL 101 01/13/2012   CO2 27 01/13/2012   Lab Results  Component Value Date   ALT 48* 01/13/2012   AST 53* 01/13/2012   ALKPHOS 65 01/13/2012   BILITOT 0.3 01/13/2012   Lab Results  Component Value Date   CHOL 200 10/03/2010   Lab Results  Component Value Date   HDL 83.40 10/03/2010   Lab Results  Component Value Date   LDLCALC 98 10/03/2010   Lab Results  Component Value Date   TRIG 95.0 10/03/2010   Lab Results  Component Value Date   CHOLHDL 2 10/03/2010     Assessment & Plan  Diarrhea Needs clear liquids for 24 hours then bland/brat diet and then progress as tolerated, needs to start a probioitc and report if symptoms do not resolve.  Prolonged grief reaction Her husband died suddenly in 08/17/12by choking. She continues to struggle with increased sadness, anxiety as well as trouble sleeping at times. She is given a small amount of Alprazolam to use prn

## 2012-01-21 NOTE — Assessment & Plan Note (Signed)
Her husband died suddenly in July 2012 by choking. She continues to struggle with increased sadness, anxiety as well as trouble sleeping at times. She is given a small amount of Alprazolam to use prn

## 2012-01-22 ENCOUNTER — Ambulatory Visit (INDEPENDENT_AMBULATORY_CARE_PROVIDER_SITE_OTHER): Payer: BC Managed Care – PPO | Admitting: Family Medicine

## 2012-01-22 ENCOUNTER — Encounter: Payer: Self-pay | Admitting: Family Medicine

## 2012-01-22 VITALS — BP 108/78 | HR 78 | Temp 98.1°F | Ht 64.25 in | Wt 138.1 lb

## 2012-01-22 DIAGNOSIS — F4329 Adjustment disorder with other symptoms: Secondary | ICD-10-CM

## 2012-01-22 DIAGNOSIS — K5792 Diverticulitis of intestine, part unspecified, without perforation or abscess without bleeding: Secondary | ICD-10-CM

## 2012-01-22 DIAGNOSIS — F329 Major depressive disorder, single episode, unspecified: Secondary | ICD-10-CM

## 2012-01-22 DIAGNOSIS — R197 Diarrhea, unspecified: Secondary | ICD-10-CM

## 2012-01-22 DIAGNOSIS — K5732 Diverticulitis of large intestine without perforation or abscess without bleeding: Secondary | ICD-10-CM

## 2012-01-22 DIAGNOSIS — K5790 Diverticulosis of intestine, part unspecified, without perforation or abscess without bleeding: Secondary | ICD-10-CM | POA: Insufficient documentation

## 2012-01-22 HISTORY — DX: Diverticulosis of intestine, part unspecified, without perforation or abscess without bleeding: K57.90

## 2012-01-22 MED ORDER — CIPROFLOXACIN HCL 500 MG PO TABS: 500.0000 mg | ORAL_TABLET | Freq: Two times a day (BID) | ORAL | Status: AC

## 2012-01-22 MED ORDER — METRONIDAZOLE 500 MG PO TABS: 500.0000 mg | ORAL_TABLET | Freq: Three times a day (TID) | ORAL | Status: AC

## 2012-01-22 MED ORDER — LACTINEX PO CHEW: 1.0000 | CHEWABLE_TABLET | Freq: Three times a day (TID) | ORAL | Status: DC

## 2012-01-22 NOTE — Assessment & Plan Note (Signed)
Feels the Sertraline helps her and has had good results when she has needed the Alprazolam intermittently

## 2012-01-22 NOTE — Progress Notes (Signed)
Patient ID: Kristen Cooke, female   DOB: [birth year], 51 y.o.   MRN: [id number] Kristen Cooke [id number] Mar 04, 1961 01/22/2012      Progress Note-Follow Up  Subjective  Chief Complaint  Chief Complaint  Patient presents with  . Follow-up    stomach pains    HPI  Patient is a 51 yo Caucasian female who is today for persistent diarrhea and now fevers/chills. She was 5 loose stools daily since the beginning of March. Seems to be worse when she eats fatty or spicy foods but occurs daily. At her last visit here she was not having any fevers or chills over the last week she's noted chills and fevers intermittently in the evenings. She has abdominal pain only right before a bowel movement and described as lower abdominal cramping. In between will tell. She does have some mild anorexia but no nausea or vomiting. Zantac is helping her heartburn she has no symptoms in that regard. She has used low-grade aspirin for her fevers with good effect does not take it routinely. She was diagnosed with diverticulosis last year during a colonoscopy. No back pain. No chest pain, palpitations, shortness of breath. She does feel the Sertraline is helping her mood in the operating room as needed infrequently but does help when she uses it.  Past Medical History  Diagnosis Date  . Chicken pox   . Depression   . Anxiety   . Arthritis     back  . Diarrhea 01/21/2012  . 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 Diverticulosis 01/22/2012    Past Surgical History  Procedure Date  . Abdominal hysterectomy 2002    vaginal, partial  . Tonsillectomy 1967  . Titanium rod insertion 1978    right femur  . Titanium rod removal 1980    bone graph, re-insertion  . Titanium rod removal 1981  . Ganglion cyst excision 1987    right wrist  . Bunionectomy 2004    b/l    Family History  Problem Relation Age of Onset  . Hypertension Father   . Hyperlipidemia Father   . Coronary artery disease Father   . Hypertension Mother     . Osteoarthritis Mother   . Osteoporosis Mother   . Scoliosis Mother   . Irritable bowel syndrome Mother   . Obesity Sister   . Hypothyroidism Sister   . Cancer Maternal Grandmother     Breast  . Pneumonia Maternal Grandmother   . Cancer Paternal Grandmother   . Cancer Paternal Grandfather     colon  . Colon cancer Paternal Grandfather     History   Social History  . Marital Status: Widowed    Spouse Name: N/A    Number of Children: N/A  . Years of Education: N/A   Occupational History  . Not on file.   Social History Main Topics  . Smoking status: Never Smoker   . Smokeless tobacco: Never Used  . Alcohol Use: 0.0 oz/week    7-14 drink(s) per week     1-2 daily  . Drug Use: No  . Sexually Active: Yes -- Female partner(s)   Other Topics Concern  . Not on file   Social History Narrative  . No narrative on file    Current Outpatient Prescriptions on File Prior to Visit  Medication Sig Dispense Refill  . ALPRAZolam (XANAX) 0.25 MG tablet 1/2 to 2 tabs po bid prn anxiety, insomnia  60 tablet  1  . Loperamide HCl (ANTI-DIARRHEAL PO) Take  by mouth as needed.      . NON FORMULARY Take 10 mg by mouth as needed. allerclear       . ranitidine (ZANTAC) 300 MG tablet Take 1 tablet (300 mg total) by mouth at bedtime.  30 tablet  3  . sertraline (ZOLOFT) 100 MG tablet Take 1 tablet (100 mg total) by mouth daily.  30 tablet  3  . lactobacillus acidophilus & bulgar (LACTINEX) chewable tablet Chew 1 tablet by mouth 3 (three) times daily with meals.  90 tablet  1  . Naproxen (NAPROSYN PO) Take by mouth as needed.          Allergies  Allergen Reactions  . Amoxicillin   . Sulfonamide Derivatives     Review of Systems  Review of Systems  Constitutional: Positive for fever, chills and malaise/fatigue.  HENT: Negative for congestion.   Eyes: Negative for discharge.  Respiratory: Negative for shortness of breath.   Cardiovascular: Negative for chest pain, palpitations and leg  swelling.  Gastrointestinal: Positive for diarrhea. Negative for heartburn, nausea, vomiting, abdominal pain, constipation, blood in stool and melena.       Cramping only occurs just prior to a loose stool, no persistent abdominal pain, cramps are predominantly in b/l lower quadrants  Genitourinary: Negative for dysuria.  Musculoskeletal: Negative for myalgias, back pain, joint pain and falls.  Skin: Negative for rash.  Neurological: Negative for loss of consciousness and headaches.  Endo/Heme/Allergies: Negative for polydipsia.  Psychiatric/Behavioral: Negative for depression and suicidal ideas. The patient is nervous/anxious and has insomnia.     Objective  BP 108/78  Pulse 78  Temp(Src) 98.1 F (36.7 C) (Temporal)  Ht 5' 4.25" (1.632 m)  Wt 138 lb 1.9 oz (62.651 kg)  BMI 23.52 kg/m2  SpO2 97%  Physical Exam  Physical Exam  Constitutional: She is oriented to person, place, and time and well-developed, well-nourished, and in no distress. No distress.  HENT:  Head: Normocephalic and atraumatic.  Eyes: Conjunctivae are normal.  Neck: Neck supple. No thyromegaly present.  Cardiovascular: Normal rate, regular rhythm and normal heart sounds.   No murmur heard. Pulmonary/Chest: Effort normal and breath sounds normal. She has no wheezes.  Abdominal: She exhibits no distension and no mass.  Musculoskeletal: She exhibits no edema.  Lymphadenopathy:    She has no cervical adenopathy.  Neurological: She is alert and oriented to person, place, and time.  Skin: Skin is warm and dry. No rash noted. She is not diaphoretic.  Psychiatric: Memory, affect and judgment normal.    Lab Results  Component Value Date   TSH 3.52 01/13/2012   Lab Results  Component Value Date   WBC 8.2 01/13/2012   HGB 14.1 01/13/2012   HCT 43.0 01/13/2012   MCV 96.2 01/13/2012   PLT 240.0 01/13/2012   Lab Results  Component Value Date   CREATININE 1.0 01/13/2012   BUN 20 01/13/2012   NA 138 01/13/2012   K  5.2* 01/13/2012   CL 101 01/13/2012   CO2 27 01/13/2012   Lab Results  Component Value Date   ALT 48* 01/13/2012   AST 53* 01/13/2012   ALKPHOS 65 01/13/2012   BILITOT 0.3 01/13/2012   Lab Results  Component Value Date   CHOL 200 10/03/2010   Lab Results  Component Value Date   HDL 83.40 10/03/2010   Lab Results  Component Value Date   LDLCALC 98 10/03/2010   Lab Results  Component Value Date   TRIG 95.0 10/03/2010  Lab Results  Component Value Date   CHOLHDL 2 10/03/2010     Assessment & Plan  Diverticulosis Recent colonoscopy showed diverticulosis and now she is having persistent diarrhea and intermittent fevers. Will rx ciprofloxacin and Flagyl to treat a divericular flare and reassess at next visit or as needed  Diarrhea Started Lactinex. Needs 24 hours of clear liquids and then a bland diet. Stool cultures ordered today and add a fiber supplement once to twice a day. Report worsening symptoms  Prolonged grief reaction Feels the Sertraline helps her and has had good results when she has needed the Alprazolam intermittently

## 2012-01-22 NOTE — Assessment & Plan Note (Signed)
Recent colonoscopy showed diverticulosis and now she is having persistent diarrhea and intermittent fevers. Will rx ciprofloxacin and Flagyl to treat a divericular flare and reassess at next visit or as needed

## 2012-01-22 NOTE — Assessment & Plan Note (Signed)
Started Lactinex. Needs 24 hours of clear liquids and then a bland diet. Stool cultures ordered today and add a fiber supplement once to twice a day. Report worsening symptoms

## 2012-01-22 NOTE — Telephone Encounter (Signed)
Pt informed and coming in today at 10:15

## 2012-01-22 NOTE — Telephone Encounter (Signed)
RX sent to pharmacy  

## 2012-01-22 NOTE — Patient Instructions (Signed)
Diverticulitis A diverticulum is a small pouch or sac on the colon. Diverticulosis is the presence of these diverticula on the colon. Diverticulitis is the irritation (inflammation) or infection of diverticula. CAUSES  The colon and its diverticula contain bacteria. If food particles block the tiny opening to a diverticulum, the bacteria inside can grow and cause an increase in pressure. This leads to infection and inflammation and is called diverticulitis. SYMPTOMS   Abdominal pain and tenderness. Usually, the pain is located on the left side of your abdomen. However, it could be located elsewhere.   Fever.   Bloating.   Feeling sick to your stomach (nausea).   Throwing up (vomiting).   Abnormal stools.  DIAGNOSIS  Your caregiver will take a history and perform a physical exam. Since many things can cause abdominal pain, other tests may be necessary. Tests may include:  Blood tests.   Urine tests.   X-ray of the abdomen.   CT scan of the abdomen.  Sometimes, surgery is needed to determine if diverticulitis or other conditions are causing your symptoms. TREATMENT  Most of the time, you can be treated without surgery. Treatment includes:  Resting the bowels by only having liquids for a few days. As you improve, you will need to eat a low-fiber diet.   Intravenous (IV) fluids if you are losing body fluids (dehydrated).   Antibiotic medicines that treat infections may be given.   Pain and nausea medicine, if needed.   Surgery if the inflamed diverticulum has burst.  HOME CARE INSTRUCTIONS   Try a clear liquid diet (broth, tea, or water for as long as directed by your caregiver). You may then gradually begin a low-fiber diet as tolerated. A low-fiber diet is a diet with less than 10 grams of fiber. Choose the foods below to reduce fiber in the diet:   White breads, cereals, rice, and pasta.   Cooked fruits and vegetables or soft fresh fruits and vegetables without the skin.     Ground or well-cooked tender beef, ham, veal, lamb, pork, or poultry.   Eggs and seafood.   After your diverticulitis symptoms have improved, your caregiver may put you on a high-fiber diet. A high-fiber diet includes 14 grams of fiber for every 1000 calories consumed. For a standard 2000 calorie diet, you would need 28 grams of fiber. Follow these diet guidelines to help you increase the fiber in your diet. It is important to slowly increase the amount fiber in your diet to avoid gas, constipation, and bloating.   Choose whole-grain breads, cereals, pasta, and brown rice.   Choose fresh fruits and vegetables with the skin on. Do not overcook vegetables because the more vegetables are cooked, the more fiber is lost.   Choose more nuts, seeds, legumes, dried peas, beans, and lentils.   Look for food products that have greater than 3 grams of fiber per serving on the Nutrition Facts label.   Take all medicine as directed by your caregiver.   If your caregiver has given you a follow-up appointment, it is very important that you go. Not going could result in lasting (chronic) or permanent injury, pain, and disability. If there is any problem keeping the appointment, call to reschedule.  SEEK MEDICAL CARE IF:   Your pain does not improve.   You have a hard time advancing your diet beyond clear liquids.   Your bowel movements do not return to normal.  SEEK IMMEDIATE MEDICAL CARE IF:   Your pain becomes   worse.   You have an oral temperature above 102 F (38.9 C), not controlled by medicine.   You have repeated vomiting.   You have bloody or black, tarry stools.   Symptoms that brought you to your caregiver become worse or are not getting better.  MAKE SURE YOU:   Understand these instructions.   Will watch your condition.   Will get help right away if you are not doing well or get worse.  Document Released: 07/17/2005 Document Revised: 09/26/2011 Document Reviewed:  11/12/2010 [employer] Patient Information 2012 [employer]. 

## 2012-01-23 LAB — FECAL LACTOFERRIN, QUANT: Lactoferrin: POSITIVE

## 2012-01-23 LAB — GIARDIA ANTIGEN: Giardia Screen (EIA): NEGATIVE

## 2012-01-24 LAB — OVA AND PARASITE EXAMINATION: OP: NONE SEEN

## 2012-01-28 MED ORDER — FLUCONAZOLE 150 MG PO TABS: 150.0000 mg | ORAL_TABLET | Freq: Once | ORAL | Status: DC

## 2012-01-28 NOTE — Progress Notes (Signed)
Addended by: Court Joy on: 01/28/2012 09:06 AM   Modules accepted: Orders

## 2012-02-14 ENCOUNTER — Ambulatory Visit (INDEPENDENT_AMBULATORY_CARE_PROVIDER_SITE_OTHER): Payer: BC Managed Care – PPO | Admitting: Family Medicine

## 2012-02-14 ENCOUNTER — Encounter: Payer: Self-pay | Admitting: Family Medicine

## 2012-02-14 VITALS — BP 120/79 | HR 70 | Temp 97.7°F | Ht 64.25 in | Wt 135.4 lb

## 2012-02-14 DIAGNOSIS — F329 Major depressive disorder, single episode, unspecified: Secondary | ICD-10-CM

## 2012-02-14 DIAGNOSIS — K5790 Diverticulosis of intestine, part unspecified, without perforation or abscess without bleeding: Secondary | ICD-10-CM

## 2012-02-14 DIAGNOSIS — K5730 Diverticulosis of large intestine without perforation or abscess without bleeding: Secondary | ICD-10-CM

## 2012-02-14 DIAGNOSIS — R197 Diarrhea, unspecified: Secondary | ICD-10-CM

## 2012-02-14 DIAGNOSIS — F4329 Adjustment disorder with other symptoms: Secondary | ICD-10-CM

## 2012-02-14 MED ORDER — TRIAMCINOLONE ACETONIDE 0.1 % EX CREA: TOPICAL_CREAM | Freq: Two times a day (BID) | CUTANEOUS | Status: DC | PRN

## 2012-02-14 NOTE — Assessment & Plan Note (Signed)
Patient feeling much better, happy with her response to Sertraline will continue the same for now and she will call if she feels overwhelmed again

## 2012-02-14 NOTE — Patient Instructions (Signed)
Fungus Infection of the Skin An infection of your skin caused by a fungus is a very common problem. Treatment depends on which part of the body is affected. Types of fungal skin infection include:  Athlete's Foot(Tinea pedis). This infection starts between the toes and may involve the entire sole and sides of foot. It is the most common fungal disease. It is made worse by heat, moisture, and friction. To treat, wash your feet 2 to 3 times daily. Dry thoroughly between the toes. Use medicated foot powder or cream as directed on the package. Plain talc, cornstarch, or rice powder may be dusted into socks and shoes to keep the feet dry. Wearing footwear that allows ventilation is also helpful.   Ringworm (Tinea corporis and tinea capitis). This infection causes scaly red rings to form on the skin or scalp. For skin sores, apply medicated lotion or cream as directed on the package. For the scalp, medicated shampoo may be used with with other therapies. Ringworm of the scalp or fingernails usually requires using oral medicine for 2 to 4 months.   Tinea versicolor. This infection appears as painless, scaly, patchy areas of discolored skin (whitish to light brown). It is more common in the summer and favors oily areas of the skin such as those found at the chest, abdomen, back, pubis, neck, and body folds. It can be treated with medicated shampoo or with medicated topical cream. Oral antifungals may be needed for more active infections. The light and/or dark spots may take time to get better and is not a sign of treatment failure.  Fungal infections may need to be treated for several weeks to be cured. It is important not to treat fungal infections with steroids or combination medicine that contains an antifungal and steroid as these will make the fungal infection worse. SEEK MEDICAL CARE IF:   You have persistent itching or rawness.   You have an oral temperature above 102 F (38.9 C).  Document Released:  11/14/2004 Document Revised: 09/26/2011 Document Reviewed: 01/30/2010 [employer] Patient Information 2012 [employer]. 

## 2012-02-14 NOTE — Assessment & Plan Note (Signed)
Abdominal pain gone, bowels almost back to normal. No further treatment at this time

## 2012-02-14 NOTE — Assessment & Plan Note (Signed)
Bowels have slowed down tremendously. Will have her increase her fiber supplement to twice daily due to the loose consistency of her daily BM and continue her probiotic as well

## 2012-02-14 NOTE — Progress Notes (Signed)
Patient ID: Kristen Cooke, female   DOB: 04-20-1961, 51 y.o.   MRN: [id number] Kristen Cooke [id number] December 17, 1960 02/14/2012      Progress Note-Follow Up  Subjective  Chief Complaint  Chief Complaint  Patient presents with  . Follow-up    1 month- on stomach issues    HPI  Patient is a 51 year old Caucasian female in today for followup. Overall she is greatly improved. She is to return December last night and did not have any abdominal pain or diarrhea after this event. Her hips are not bothering. It has been as they were nor is her back. Her grief is still present but manageable. She feels sertraline is helping well and she is beginning to feel some improvement in her anhedonia and her mood. She denies any suicidal ideation and feels hopeful for the first time since her husband died. She denies any chest pain, palpitations, diarrhea, GI or GU complaints. She notes that her bowels have slowed down considerably. No bloody or tarry stool. No abdominal cramping fevers, chills, back pain. She has a stool daily that is softer than she previously has had but improved. She is taking a probiotic and fiber once daily.  Past Medical History  Diagnosis Date  . Chicken pox   . Depression   . Anxiety   . Arthritis     back  . Diarrhea 01/21/2012  . 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 Diverticulosis 01/22/2012    Past Surgical History  Procedure Date  . Abdominal hysterectomy 2002    vaginal, partial  . Tonsillectomy 1967  . Titanium rod insertion 1978    right femur  . Titanium rod removal 1980    bone graph, re-insertion  . Titanium rod removal 1981  . Ganglion cyst excision 1987    right wrist  . Bunionectomy 2004    b/l    Family History  Problem Relation Age of Onset  . Hypertension Father   . Hyperlipidemia Father   . Coronary artery disease Father   . Hypertension Mother   . Osteoarthritis Mother   . Osteoporosis Mother   . Scoliosis Mother   . Irritable bowel syndrome  Mother   . Obesity Sister   . Hypothyroidism Sister   . Cancer Maternal Grandmother     Breast  . Pneumonia Maternal Grandmother   . Cancer Paternal Grandmother   . Cancer Paternal Grandfather     colon  . Colon cancer Paternal Grandfather     History   Social History  . Marital Status: Widowed    Spouse Name: N/A    Number of Children: N/A  . Years of Education: N/A   Occupational History  . Not on file.   Social History Main Topics  . Smoking status: Never Smoker   . Smokeless tobacco: Never Used  . Alcohol Use: 0.0 oz/week    7-14 drink(s) per week     1-2 daily  . Drug Use: No  . Sexually Active: Yes -- Female partner(s)   Other Topics Concern  . Not on file   Social History Narrative  . No narrative on file    Current Outpatient Prescriptions on File Prior to Visit  Medication Sig Dispense Refill  . ALPRAZolam (XANAX) 0.25 MG tablet 1/2 to 2 tabs po bid prn anxiety, insomnia  60 tablet  1  . lactobacillus acidophilus & bulgar (LACTINEX) chewable tablet Chew 1 tablet by mouth 3 (three) times daily with meals.  90 tablet  1  .  NON FORMULARY Take 10 mg by mouth as needed. allerclear       . ranitidine (ZANTAC) 300 MG tablet Take 1 tablet (300 mg total) by mouth at bedtime.  30 tablet  3  . sertraline (ZOLOFT) 100 MG tablet Take 1 tablet (100 mg total) by mouth daily.  30 tablet  3  . Loperamide HCl (ANTI-DIARRHEAL PO) Take by mouth as needed.      . Naproxen (NAPROSYN PO) Take by mouth as needed.          Allergies  Allergen Reactions  . Amoxicillin   . Sulfonamide Derivatives     Review of Systems  Review of Systems  Constitutional: Negative for fever and malaise/fatigue.  HENT: Negative for congestion.   Eyes: Negative for discharge.  Respiratory: Negative for shortness of breath.   Cardiovascular: Negative for chest pain, palpitations and leg swelling.  Gastrointestinal: Negative for heartburn, nausea, vomiting, abdominal pain, diarrhea,  constipation, blood in stool and melena.       Stool still soft but not watery or frequent  Genitourinary: Negative for dysuria.  Musculoskeletal: Negative for falls.  Skin: Negative for rash.  Neurological: Negative for loss of consciousness and headaches.  Endo/Heme/Allergies: Negative for polydipsia.  Psychiatric/Behavioral: Negative for depression and suicidal ideas. The patient is not nervous/anxious and does not have insomnia.     Objective  BP 120/79  Pulse 70  Temp(Src) 97.7 F (36.5 C) (Temporal)  Ht 5' 4.25" (1.632 m)  Wt 135 lb 6.4 oz (61.417 kg)  BMI 23.06 kg/m2  SpO2 100%  Physical Exam  Physical Exam  Constitutional: She is oriented to person, place, and time and well-developed, well-nourished, and in no distress. No distress.  HENT:  Head: Normocephalic and atraumatic.  Eyes: Conjunctivae are normal.  Neck: Neck supple. No thyromegaly present.  Cardiovascular: Normal rate, regular rhythm and normal heart sounds.   No murmur heard. Pulmonary/Chest: Effort normal and breath sounds normal. She has no wheezes.  Abdominal: She exhibits no distension and no mass.  Musculoskeletal: She exhibits no edema.  Lymphadenopathy:    She has no cervical adenopathy.  Neurological: She is alert and oriented to person, place, and time.  Skin: Skin is warm and dry. No rash noted. She is not diaphoretic.  Psychiatric: Memory, affect and judgment normal.    Lab Results  Component Value Date   TSH 3.52 01/13/2012   Lab Results  Component Value Date   WBC 8.2 01/13/2012   HGB 14.1 01/13/2012   HCT 43.0 01/13/2012   MCV 96.2 01/13/2012   PLT 240.0 01/13/2012   Lab Results  Component Value Date   CREATININE 1.0 01/13/2012   BUN 20 01/13/2012   NA 138 01/13/2012   K 5.2* 01/13/2012   CL 101 01/13/2012   CO2 27 01/13/2012   Lab Results  Component Value Date   ALT 48* 01/13/2012   AST 53* 01/13/2012   ALKPHOS 65 01/13/2012   BILITOT 0.3 01/13/2012   Lab Results  Component  Value Date   CHOL 200 10/03/2010   Lab Results  Component Value Date   HDL 83.40 10/03/2010   Lab Results  Component Value Date   LDLCALC 98 10/03/2010   Lab Results  Component Value Date   TRIG 95.0 10/03/2010   Lab Results  Component Value Date   CHOLHDL 2 10/03/2010     Assessment & Plan  Prolonged grief reaction Patient feeling much better, happy with her response to Sertraline will continue the same  for now and she will call if she feels overwhelmed again  Diverticulosis Abdominal pain gone, bowels almost back to normal. No further treatment at this time  Diarrhea Bowels have slowed down tremendously. Will have her increase her fiber supplement to twice daily due to the loose consistency of her daily BM and continue her probiotic as well

## 2012-05-04 ENCOUNTER — Other Ambulatory Visit: Payer: Self-pay

## 2012-05-04 DIAGNOSIS — F418 Other specified anxiety disorders: Secondary | ICD-10-CM

## 2012-05-04 MED ORDER — SERTRALINE HCL 100 MG PO TABS: 100.0000 mg | ORAL_TABLET | Freq: Every day | ORAL | Status: DC

## 2012-05-15 IMAGING — CR DG HIP (WITH OR WITHOUT PELVIS) 2-3V*L*
3 series · 3 of 3 positions shown · non-contrast
Comparison: None.

CLINICAL DATA: Low back pain and left hip pain.

LEFT HIP - COMPLETE 2+ VIEW

[t pelvis a.p.]
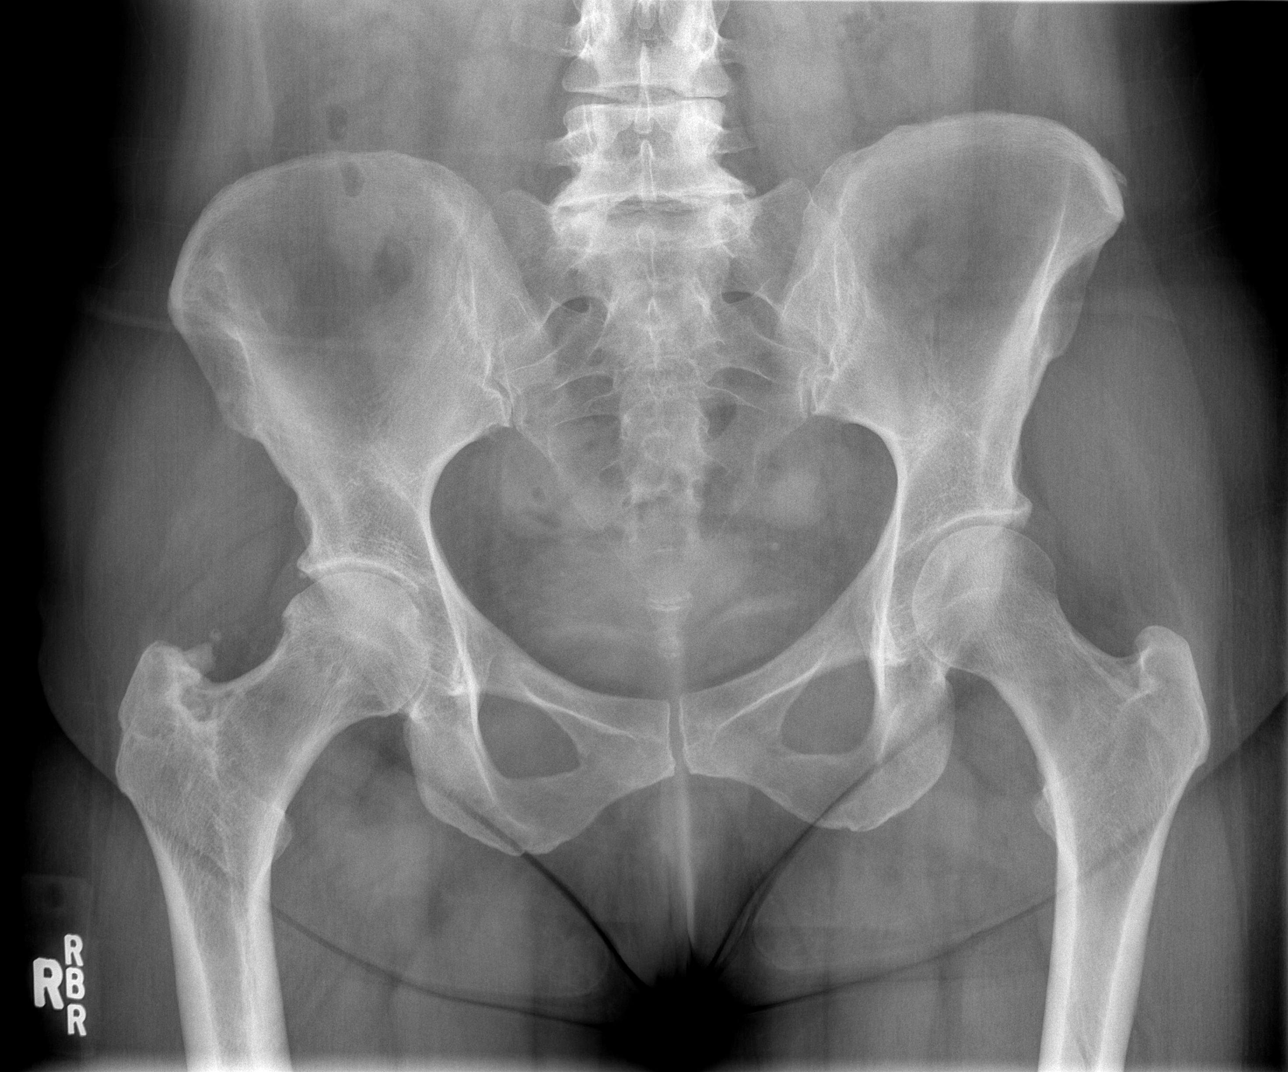

[t hip ap left]
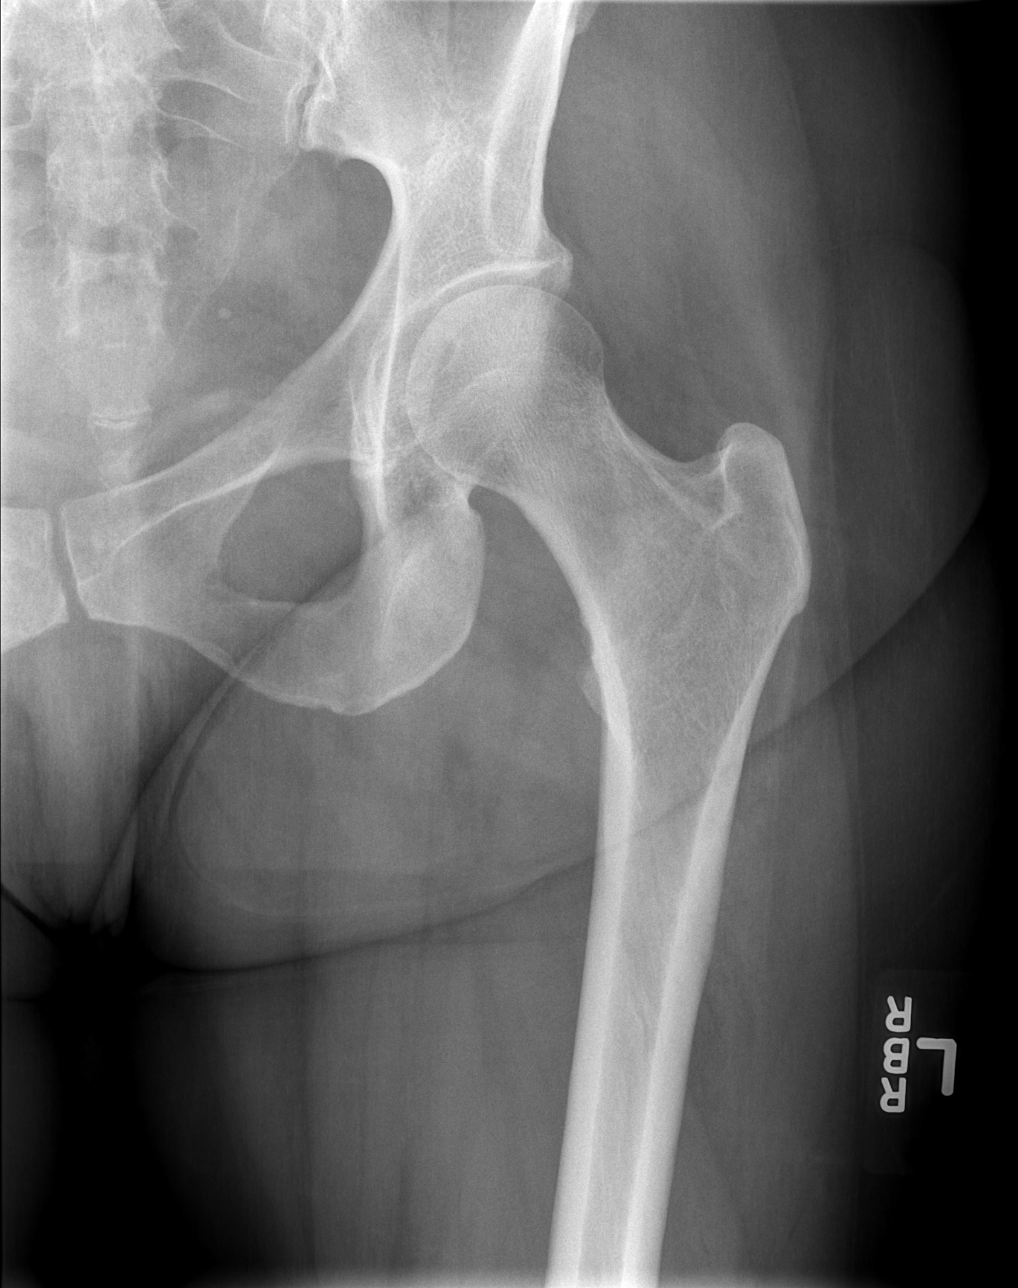

[t hip frog leg left]
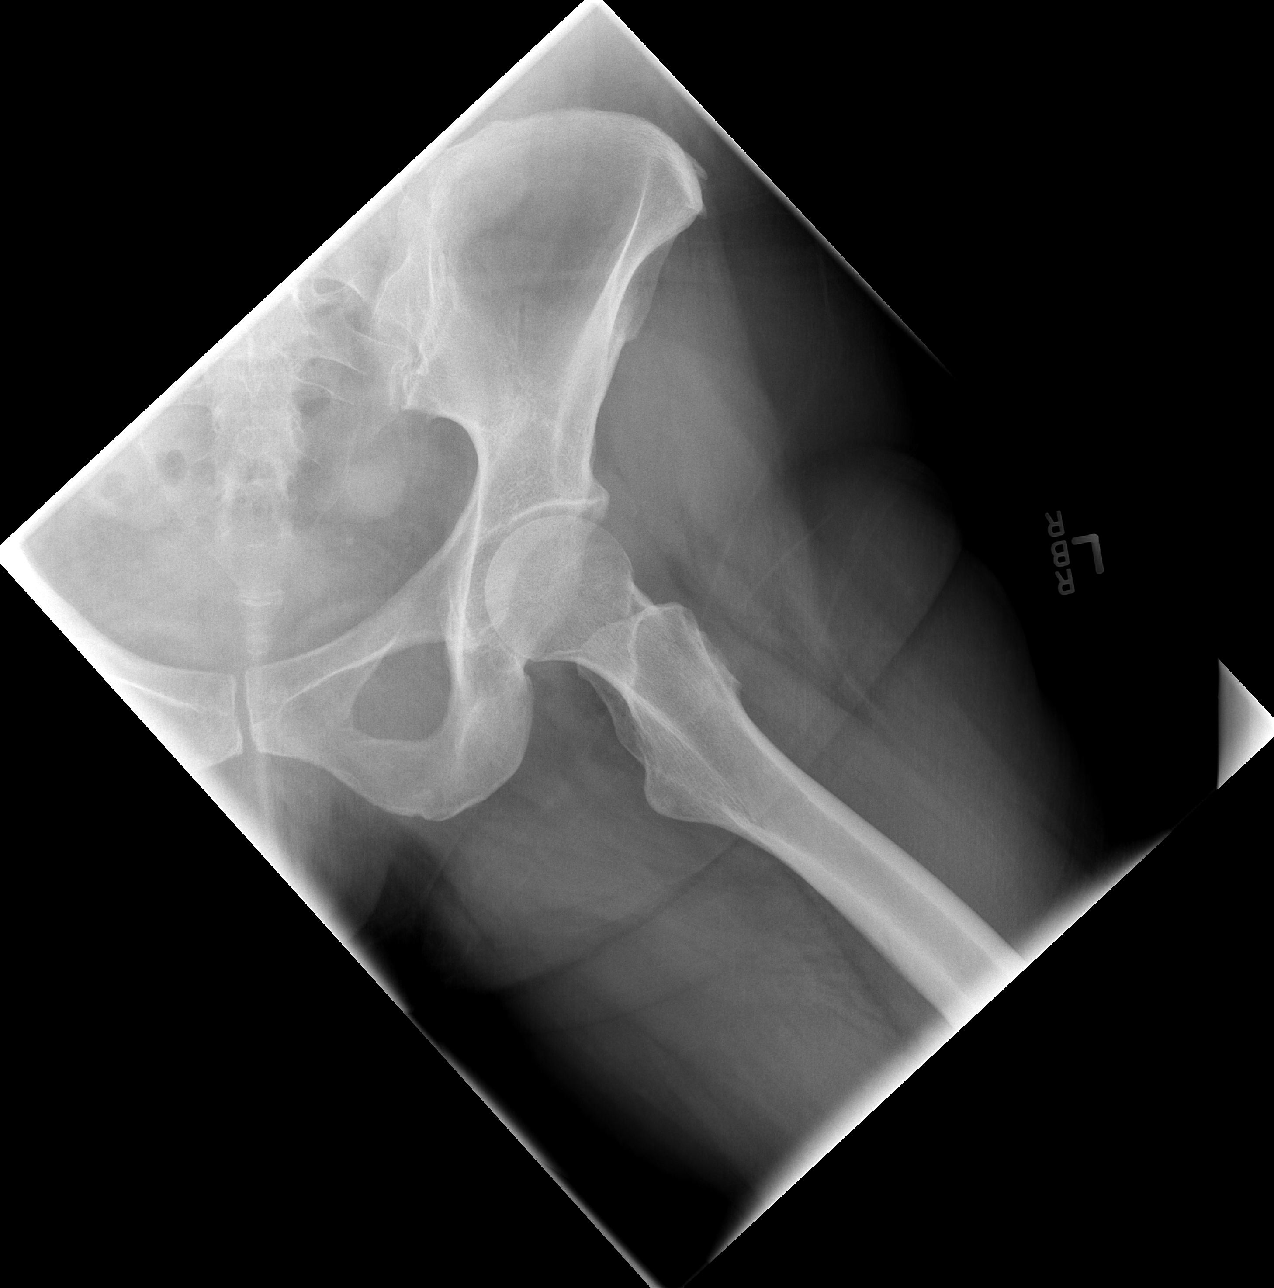

[3 of 3 positions shown; findings below may reference images not displayed]

FINDINGS: Hip joint space is maintained bilaterally.  No remarkable
subchondral sclerosis or cyst formation, nor osteophytosis.  There
are postoperative changes in the right femur, with mild
degenerative change in the right hip.
IMPRESSION: 1.  No acute or remarkable chronic findings in the left hip.
2.  Mild right hip osteoarthritis.

## 2012-06-08 ENCOUNTER — Telehealth: Payer: Self-pay | Admitting: Family Medicine

## 2012-06-08 ENCOUNTER — Other Ambulatory Visit (INDEPENDENT_AMBULATORY_CARE_PROVIDER_SITE_OTHER): Payer: BC Managed Care – PPO

## 2012-06-08 DIAGNOSIS — Z0000 Encounter for general adult medical examination without abnormal findings: Secondary | ICD-10-CM

## 2012-06-08 LAB — RENAL FUNCTION PANEL
CO2: 29 mEq/L (ref 19–32)
Calcium: 9.3 mg/dL (ref 8.4–10.5)
Creatinine, Ser: 0.5 mg/dL (ref 0.4–1.2)
Potassium: 4.1 mEq/L (ref 3.5–5.1)
Sodium: 138 mEq/L (ref 135–145)

## 2012-06-08 LAB — CBC
HCT: 40.6 % (ref 36.0–46.0)
RBC: 4.3 Mil/uL (ref 3.87–5.11)
WBC: 5.2 10*3/uL (ref 4.5–10.5)

## 2012-06-08 LAB — TSH: TSH: 3.42 u[IU]/mL (ref 0.35–5.50)

## 2012-06-08 LAB — HEPATIC FUNCTION PANEL
ALT: 25 U/L (ref 0–35)
Bilirubin, Direct: 0 mg/dL (ref 0.0–0.3)
Total Bilirubin: 0.6 mg/dL (ref 0.3–1.2)

## 2012-06-08 LAB — LIPASE: Lipase: 34 U/L (ref 11.0–59.0)

## 2012-06-08 MED ORDER — SERTRALINE HCL 50 MG PO TABS: 50.0000 mg | ORAL_TABLET | Freq: Every day | ORAL | Status: DC

## 2012-06-08 NOTE — Telephone Encounter (Signed)
Patient informed and states that she was always on 50 mg until 1 year ago when her husband passed. Patient states she feels better now. Pt also voiced understanding about making a follow up appt

## 2012-06-08 NOTE — Telephone Encounter (Signed)
OK.  Please just clarify with the patient that the reason for the decrease is that she is feeling better regarding depression/emotional issues.  I'll do rx for 50mg  caps for her to take 1 qd, with RF x 2.  Ask her to arrange f/u appt with Dr. Abner Greenspan before these RF's run out. -thx

## 2012-06-08 NOTE — Telephone Encounter (Signed)
Please advise 

## 2012-06-09 NOTE — Progress Notes (Signed)
Quick Note:  Patient Informed and voiced understanding ______ 

## 2012-06-09 NOTE — Telephone Encounter (Signed)
OK to decrease her to 50 mg of Sertraline daily and have her come in for reeval in 4-6 weeks or sooner as needed

## 2012-06-09 NOTE — Telephone Encounter (Signed)
Already done yesterday

## 2012-06-15 ENCOUNTER — Encounter: Payer: Self-pay | Admitting: Family Medicine

## 2012-06-15 ENCOUNTER — Ambulatory Visit (INDEPENDENT_AMBULATORY_CARE_PROVIDER_SITE_OTHER): Payer: BC Managed Care – PPO | Admitting: Family Medicine

## 2012-06-15 VITALS — BP 130/82 | HR 66 | Temp 97.0°F | Ht 64.25 in | Wt 139.0 lb

## 2012-06-15 DIAGNOSIS — F329 Major depressive disorder, single episode, unspecified: Secondary | ICD-10-CM

## 2012-06-15 DIAGNOSIS — K219 Gastro-esophageal reflux disease without esophagitis: Secondary | ICD-10-CM

## 2012-06-15 DIAGNOSIS — Z23 Encounter for immunization: Secondary | ICD-10-CM

## 2012-06-15 DIAGNOSIS — L989 Disorder of the skin and subcutaneous tissue, unspecified: Secondary | ICD-10-CM

## 2012-06-15 DIAGNOSIS — F341 Dysthymic disorder: Secondary | ICD-10-CM

## 2012-06-15 DIAGNOSIS — E785 Hyperlipidemia, unspecified: Secondary | ICD-10-CM

## 2012-06-15 DIAGNOSIS — K5730 Diverticulosis of large intestine without perforation or abscess without bleeding: Secondary | ICD-10-CM

## 2012-06-15 DIAGNOSIS — Z0000 Encounter for general adult medical examination without abnormal findings: Secondary | ICD-10-CM

## 2012-06-15 DIAGNOSIS — K5790 Diverticulosis of intestine, part unspecified, without perforation or abscess without bleeding: Secondary | ICD-10-CM

## 2012-06-15 DIAGNOSIS — F4329 Adjustment disorder with other symptoms: Secondary | ICD-10-CM

## 2012-06-15 DIAGNOSIS — R1013 Epigastric pain: Secondary | ICD-10-CM

## 2012-06-15 DIAGNOSIS — IMO0001 Reserved for inherently not codable concepts without codable children: Secondary | ICD-10-CM

## 2012-06-15 DIAGNOSIS — F418 Other specified anxiety disorders: Secondary | ICD-10-CM

## 2012-06-15 HISTORY — DX: Disorder of the skin and subcutaneous tissue, unspecified: L98.9

## 2012-06-15 LAB — LIPID PANEL
Cholesterol: 211 mg/dL — ABNORMAL HIGH (ref 0–200)
Total CHOL/HDL Ratio: 2
Triglycerides: 84 mg/dL (ref 0.0–149.0)
VLDL: 16.8 mg/dL (ref 0.0–40.0)

## 2012-06-15 MED ORDER — SERTRALINE HCL 50 MG PO TABS: 50.0000 mg | ORAL_TABLET | Freq: Every day | ORAL | Status: DC

## 2012-06-15 MED ORDER — TETANUS-DIPHTH-ACELL PERTUSSIS 5-2.5-18.5 LF-MCG/0.5 IM SUSP: 0.5000 mL | Freq: Once | INTRAMUSCULAR | Status: DC

## 2012-06-15 MED ORDER — RANITIDINE HCL 300 MG PO TABS: 300.0000 mg | ORAL_TABLET | Freq: Every evening | ORAL | Status: DC | PRN

## 2012-06-15 MED ORDER — LACTINEX PO CHEW: 1.0000 | CHEWABLE_TABLET | Freq: Three times a day (TID) | ORAL | Status: DC

## 2012-06-15 NOTE — Assessment & Plan Note (Signed)
Doing well, given refill on Ranitidine and Lactinex today which she uses as needed

## 2012-06-15 NOTE — Assessment & Plan Note (Addendum)
Has past the year mark of her husband's death, doing better has dropped her Sertraline back down to 50 mg daily without trouble, given refills today. Uses Alprazolam very infrequently, no refills needed

## 2012-06-15 NOTE — Assessment & Plan Note (Signed)
Asymptomatic encouraged hi fiber diet, good hydration, yogurt

## 2012-06-15 NOTE — Assessment & Plan Note (Addendum)
Under upper left arm, present several weeks, enlarging Referred back to her dermatologist for further evaluation

## 2012-06-15 NOTE — Assessment & Plan Note (Addendum)
Tdap today, encouraged exercise and heart healthy diet

## 2012-06-15 NOTE — Patient Instructions (Addendum)
Preventive Care for Adults, Female A healthy lifestyle and preventive care can promote health and wellness. Preventive health guidelines for women include the following key practices.  A routine yearly physical is a good way to check with your caregiver about your health and preventive screening. It is a chance to share any concerns and updates on your health, and to receive a thorough exam.   Visit your dentist for a routine exam and preventive care every 6 months. Brush your teeth twice a day and floss once a day. Good oral hygiene prevents tooth decay and gum disease.   The frequency of eye exams is based on your age, health, family medical history, use of contact lenses, and other factors. Follow your caregiver's recommendations for frequency of eye exams.   Eat a healthy diet. Foods like vegetables, fruits, whole grains, low-fat dairy products, and lean protein foods contain the nutrients you need without too many calories. Decrease your intake of foods high in solid fats, added sugars, and salt. Eat the right amount of calories for you.Get information about a proper diet from your caregiver, if necessary.   Regular physical exercise is one of the most important things you can do for your health. Most adults should get at least 150 minutes of moderate-intensity exercise (any activity that increases your heart rate and causes you to sweat) each week. In addition, most adults need muscle-strengthening exercises on 2 or more days a week.   Maintain a healthy weight. The body mass index (BMI) is a screening tool to identify possible weight problems. It provides an estimate of body fat based on height and weight. Your caregiver can help determine your BMI, and can help you achieve or maintain a healthy weight.For adults 20 years and older:   A BMI below 18.5 is considered underweight.   A BMI of 18.5 to 24.9 is normal.   A BMI of 25 to 29.9 is considered overweight.   A BMI of 30 and above is  considered obese.   Maintain normal blood lipids and cholesterol levels by exercising and minimizing your intake of saturated fat. Eat a balanced diet with plenty of fruit and vegetables. Blood tests for lipids and cholesterol should begin at age 20 and be repeated every 5 years. If your lipid or cholesterol levels are high, you are over 50, or you are at high risk for heart disease, you may need your cholesterol levels checked more frequently.Ongoing high lipid and cholesterol levels should be treated with medicines if diet and exercise are not effective.   If you smoke, find out from your caregiver how to quit. If you do not use tobacco, do not start.   If you are pregnant, do not drink alcohol. If you are breastfeeding, be very cautious about drinking alcohol. If you are not pregnant and choose to drink alcohol, do not exceed 1 drink per day. One drink is considered to be 12 ounces (355 mL) of beer, 5 ounces (148 mL) of wine, or 1.5 ounces (44 mL) of liquor.   Avoid use of street drugs. Do not share needles with anyone. Ask for help if you need support or instructions about stopping the use of drugs.   High blood pressure causes heart disease and increases the risk of stroke. Your blood pressure should be checked at least every 1 to 2 years. Ongoing high blood pressure should be treated with medicines if weight loss and exercise are not effective.   If you are 55 to 52   years old, ask your caregiver if you should take aspirin to prevent strokes.   Diabetes screening involves taking a blood sample to check your fasting blood sugar level. This should be done once every 3 years, after age 45, if you are within normal weight and without risk factors for diabetes. Testing should be considered at a younger age or be carried out more frequently if you are overweight and have at least 1 risk factor for diabetes.   Breast cancer screening is essential preventive care for women. You should practice "breast  self-awareness." This means understanding the normal appearance and feel of your breasts and may include breast self-examination. Any changes detected, no matter how small, should be reported to a caregiver. Women in their 20s and 30s should have a clinical breast exam (CBE) by a caregiver as part of a regular health exam every 1 to 3 years. After age 40, women should have a CBE every year. Starting at age 40, women should consider having a mammography (breast X-ray test) every year. Women who have a family history of breast cancer should talk to their caregiver about genetic screening. Women at a high risk of breast cancer should talk to their caregivers about having magnetic resonance imaging (MRI) and a mammography every year.   The Pap test is a screening test for cervical cancer. A Pap test can show cell changes on the cervix that might become cervical cancer if left untreated. A Pap test is a procedure in which cells are obtained and examined from the lower end of the uterus (cervix).   Women should have a Pap test starting at age 21.   Between ages 21 and 29, Pap tests should be repeated every 2 years.   Beginning at age 30, you should have a Pap test every 3 years as long as the past 3 Pap tests have been normal.   Some women have medical problems that increase the chance of getting cervical cancer. Talk to your caregiver about these problems. It is especially important to talk to your caregiver if a new problem develops soon after your last Pap test. In these cases, your caregiver may recommend more frequent screening and Pap tests.   The above recommendations are the same for women who have or have not gotten the vaccine for human papillomavirus (HPV).   If you had a hysterectomy for a problem that was not cancer or a condition that could lead to cancer, then you no longer need Pap tests. Even if you no longer need a Pap test, a regular exam is a good idea to make sure no other problems are  starting.   If you are between ages 65 and 70, and you have had normal Pap tests going back 10 years, you no longer need Pap tests. Even if you no longer need a Pap test, a regular exam is a good idea to make sure no other problems are starting.   If you have had past treatment for cervical cancer or a condition that could lead to cancer, you need Pap tests and screening for cancer for at least 20 years after your treatment.   If Pap tests have been discontinued, risk factors (such as a new sexual partner) need to be reassessed to determine if screening should be resumed.   The HPV test is an additional test that may be used for cervical cancer screening. The HPV test looks for the virus that can cause the cell changes on the cervix.   The cells collected during the Pap test can be tested for HPV. The HPV test could be used to screen women aged 30 years and older, and should be used in women of any age who have unclear Pap test results. After the age of 30, women should have HPV testing at the same frequency as a Pap test.   Colorectal cancer can be detected and often prevented. Most routine colorectal cancer screening begins at the age of 50 and continues through age 75. However, your caregiver may recommend screening at an earlier age if you have risk factors for colon cancer. On a yearly basis, your caregiver may provide home test kits to check for hidden blood in the stool. Use of a small camera at the end of a tube, to directly examine the colon (sigmoidoscopy or colonoscopy), can detect the earliest forms of colorectal cancer. Talk to your caregiver about this at age 50, when routine screening begins. Direct examination of the colon should be repeated every 5 to 10 years through age 75, unless early forms of pre-cancerous polyps or small growths are found.   Hepatitis C blood testing is recommended for all people born from 1945 through 1965 and any individual with known risks for hepatitis C.    Practice safe sex. Use condoms and avoid high-risk sexual practices to reduce the spread of sexually transmitted infections (STIs). STIs include gonorrhea, chlamydia, syphilis, trichomonas, herpes, HPV, and human immunodeficiency virus (HIV). Herpes, HIV, and HPV are viral illnesses that have no cure. They can result in disability, cancer, and death. Sexually active women aged 25 and younger should be checked for chlamydia. Older women with new or multiple partners should also be tested for chlamydia. Testing for other STIs is recommended if you are sexually active and at increased risk.   Osteoporosis is a disease in which the bones lose minerals and strength with aging. This can result in serious bone fractures. The risk of osteoporosis can be identified using a bone density scan. Women ages 65 and over and women at risk for fractures or osteoporosis should discuss screening with their caregivers. Ask your caregiver whether you should take a calcium supplement or vitamin D to reduce the rate of osteoporosis.   Menopause can be associated with physical symptoms and risks. Hormone replacement therapy is available to decrease symptoms and risks. You should talk to your caregiver about whether hormone replacement therapy is right for you.   Use sunscreen with sun protection factor (SPF) of 30 or more. Apply sunscreen liberally and repeatedly throughout the day. You should seek shade when your shadow is shorter than you. Protect yourself by wearing long sleeves, pants, a wide-brimmed hat, and sunglasses year round, whenever you are outdoors.   Once a month, do a whole body skin exam, using a mirror to look at the skin on your back. Notify your caregiver of new moles, moles that have irregular borders, moles that are larger than a pencil eraser, or moles that have changed in shape or color.   Stay current with required immunizations.   Influenza. You need a dose every fall (or winter). The composition of  the flu vaccine changes each year, so being vaccinated once is not enough.   Pneumococcal polysaccharide. You need 1 to 2 doses if you smoke cigarettes or if you have certain chronic medical conditions. You need 1 dose at age 65 (or older) if you have never been vaccinated.   Tetanus, diphtheria, pertussis (Tdap, Td). Get 1 dose of   Tdap vaccine if you are younger than age 65, are over 65 and have contact with an infant, are a healthcare worker, are pregnant, or simply want to be protected from whooping cough. After that, you need a Td booster dose every 10 years. Consult your caregiver if you have not had at least 3 tetanus and diphtheria-containing shots sometime in your life or have a deep or dirty wound.   HPV. You need this vaccine if you are a woman age 26 or younger. The vaccine is given in 3 doses over 6 months.   Measles, mumps, rubella (MMR). You need at least 1 dose of MMR if you were born in 1957 or later. You may also need a second dose.   Meningococcal. If you are age 19 to 21 and a first-year college student living in a residence hall, or have one of several medical conditions, you need to get vaccinated against meningococcal disease. You may also need additional booster doses.   Zoster (shingles). If you are age 60 or older, you should get this vaccine.   Varicella (chickenpox). If you have never had chickenpox or you were vaccinated but received only 1 dose, talk to your caregiver to find out if you need this vaccine.   Hepatitis A. You need this vaccine if you have a specific risk factor for hepatitis A virus infection or you simply wish to be protected from this disease. The vaccine is usually given as 2 doses, 6 to 18 months apart.   Hepatitis B. You need this vaccine if you have a specific risk factor for hepatitis B virus infection or you simply wish to be protected from this disease. The vaccine is given in 3 doses, usually over 6 months.  Preventive Services /  Frequency Ages 19 to 39  Blood pressure check.** / Every 1 to 2 years.   Lipid and cholesterol check.** / Every 5 years beginning at age 20.   Clinical breast exam.** / Every 3 years for women in their 20s and 30s.   Pap test.** / Every 2 years from ages 21 through 29. Every 3 years starting at age 30 through age 65 or 70 with a history of 3 consecutive normal Pap tests.   HPV screening.** / Every 3 years from ages 30 through ages 65 to 70 with a history of 3 consecutive normal Pap tests.   Hepatitis C blood test.** / For any individual with known risks for hepatitis C.   Skin self-exam. / Monthly.   Influenza immunization.** / Every year.   Pneumococcal polysaccharide immunization.** / 1 to 2 doses if you smoke cigarettes or if you have certain chronic medical conditions.   Tetanus, diphtheria, pertussis (Tdap, Td) immunization. / A one-time dose of Tdap vaccine. After that, you need a Td booster dose every 10 years.   HPV immunization. / 3 doses over 6 months, if you are 26 and younger.   Measles, mumps, rubella (MMR) immunization. / You need at least 1 dose of MMR if you were born in 1957 or later. You may also need a second dose.   Meningococcal immunization. / 1 dose if you are age 19 to 21 and a first-year college student living in a residence hall, or have one of several medical conditions, you need to get vaccinated against meningococcal disease. You may also need additional booster doses.   Varicella immunization.** / Consult your caregiver.   Hepatitis A immunization.** / Consult your caregiver. 2 doses, 6 to 18 months   apart.   Hepatitis B immunization.** / Consult your caregiver. 3 doses usually over 6 months.  Ages 40 to 64  Blood pressure check.** / Every 1 to 2 years.   Lipid and cholesterol check.** / Every 5 years beginning at age 20.   Clinical breast exam.** / Every year after age 40.   Mammogram.** / Every year beginning at age 40 and continuing for as  long as you are in good health. Consult with your caregiver.   Pap test.** / Every 3 years starting at age 30 through age 65 or 70 with a history of 3 consecutive normal Pap tests.   HPV screening.** / Every 3 years from ages 30 through ages 65 to 70 with a history of 3 consecutive normal Pap tests.   Fecal occult blood test (FOBT) of stool. / Every year beginning at age 50 and continuing until age 75. You may not need to do this test if you get a colonoscopy every 10 years.   Flexible sigmoidoscopy or colonoscopy.** / Every 5 years for a flexible sigmoidoscopy or every 10 years for a colonoscopy beginning at age 50 and continuing until age 75.   Hepatitis C blood test.** / For all people born from 1945 through 1965 and any individual with known risks for hepatitis C.   Skin self-exam. / Monthly.   Influenza immunization.** / Every year.   Pneumococcal polysaccharide immunization.** / 1 to 2 doses if you smoke cigarettes or if you have certain chronic medical conditions.   Tetanus, diphtheria, pertussis (Tdap, Td) immunization.** / A one-time dose of Tdap vaccine. After that, you need a Td booster dose every 10 years.   Measles, mumps, rubella (MMR) immunization. / You need at least 1 dose of MMR if you were born in 1957 or later. You may also need a second dose.   Varicella immunization.** / Consult your caregiver.   Meningococcal immunization.** / Consult your caregiver.   Hepatitis A immunization.** / Consult your caregiver. 2 doses, 6 to 18 months apart.   Hepatitis B immunization.** / Consult your caregiver. 3 doses, usually over 6 months.  Ages 65 and over  Blood pressure check.** / Every 1 to 2 years.   Lipid and cholesterol check.** / Every 5 years beginning at age 20.   Clinical breast exam.** / Every year after age 40.   Mammogram.** / Every year beginning at age 40 and continuing for as long as you are in good health. Consult with your caregiver.   Pap test.** /  Every 3 years starting at age 30 through age 65 or 70 with a 3 consecutive normal Pap tests. Testing can be stopped between 65 and 70 with 3 consecutive normal Pap tests and no abnormal Pap or HPV tests in the past 10 years.   HPV screening.** / Every 3 years from ages 30 through ages 65 or 70 with a history of 3 consecutive normal Pap tests. Testing can be stopped between 65 and 70 with 3 consecutive normal Pap tests and no abnormal Pap or HPV tests in the past 10 years.   Fecal occult blood test (FOBT) of stool. / Every year beginning at age 50 and continuing until age 75. You may not need to do this test if you get a colonoscopy every 10 years.   Flexible sigmoidoscopy or colonoscopy.** / Every 5 years for a flexible sigmoidoscopy or every 10 years for a colonoscopy beginning at age 50 and continuing until age 75.   Hepatitis   C blood test.** / For all people born from 1945 through 1965 and any individual with known risks for hepatitis C.   Osteoporosis screening.** / A one-time screening for women ages 65 and over and women at risk for fractures or osteoporosis.   Skin self-exam. / Monthly.   Influenza immunization.** / Every year.   Pneumococcal polysaccharide immunization.** / 1 dose at age 65 (or older) if you have never been vaccinated.   Tetanus, diphtheria, pertussis (Tdap, Td) immunization. / A one-time dose of Tdap vaccine if you are over 65 and have contact with an infant, are a healthcare worker, or simply want to be protected from whooping cough. After that, you need a Td booster dose every 10 years.   Varicella immunization.** / Consult your caregiver.   Meningococcal immunization.** / Consult your caregiver.   Hepatitis A immunization.** / Consult your caregiver. 2 doses, 6 to 18 months apart.   Hepatitis B immunization.** / Check with your caregiver. 3 doses, usually over 6 months.  ** Family history and personal history of risk and conditions may change your caregiver's  recommendations. Document Released: 12/03/2001 Document Revised: 09/26/2011 Document Reviewed: 03/04/2011 [employer] Patient Information 2012 [employer]. 

## 2012-06-15 NOTE — Progress Notes (Signed)
Patient ID: Kristen Cooke, female   DOB: Dec 30, 1960, 51 y.o.   MRN: [id number] Kristen Cooke [id number] Sep 20, 1961 06/15/2012      Progress Note-Follow Up  Subjective  Chief Complaint  Chief Complaint  Patient presents with  . Annual Exam    physical    HPI  Patient is a 51 year old Caucasian female who is in today for annual exam. She's recently passed a 1 year anniversary of her husband's sudden death and does feels she's doing better. She recently called and requested we reduce her sertraline from 100 to 50 and she has tolerated that well. She says her grief and sadness are greatly improved. She still has had moments but they're not overwhelmed. No suicidal ideation. Anxiety is manageable. She is managing her value without alprazolam. No recent illness, fevers, chills, chest pain, palpitations, shortness of breath, GI or GU complaints. She is using Zantac infrequently for reflux and finds that works well. Had her colonoscopy and tolerated that well as well. Needs another one in 10 years.  Past Medical History  Diagnosis Date  . Chicken pox   . Depression   . Anxiety   . Arthritis     back  . Diarrhea 01/21/2012  . 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 Diverticulosis 01/22/2012  . Skin lesion of left arm 06/15/2012  . Anxiety and depression 01/21/2012  . Reflux 01/21/2012    Past Surgical History  Procedure Date  . Abdominal hysterectomy 2002    vaginal, partial  . Tonsillectomy 1967  . Titanium rod insertion 1978    right femur  . Titanium rod removal 1980    bone graph, re-insertion  . Titanium rod removal 1981  . Ganglion cyst excision 1987    right wrist  . Bunionectomy 2004    b/l    Family History  Problem Relation Age of Onset  . Hypertension Father   . Hyperlipidemia Father   . Coronary artery disease Father   . Hypertension Mother   . Osteoarthritis Mother   . Osteoporosis Mother   . Scoliosis Mother   . Irritable bowel syndrome Mother   . Obesity Sister   .  Hypothyroidism Sister   . Cancer Maternal Grandmother     Breast  . Pneumonia Maternal Grandmother   . Cancer Paternal Grandmother   . Cancer Paternal Grandfather     colon  . Colon cancer Paternal Grandfather     History   Social History  . Marital Status: Widowed    Spouse Name: N/A    Number of Children: N/A  . Years of Education: N/A   Occupational History  . Not on file.   Social History Main Topics  . Smoking status: Never Smoker   . Smokeless tobacco: Never Used  . Alcohol Use: 0.0 oz/week    7-14 drink(s) per week     1-2 daily  . Drug Use: No  . Sexually Active: Yes -- Female partner(s)   Other Topics Concern  . Not on file   Social History Narrative  . No narrative on file    Current Outpatient Prescriptions on File Prior to Visit  Medication Sig Dispense Refill  . ALPRAZolam (XANAX) 0.25 MG tablet 1/2 to 2 tabs po bid prn anxiety, insomnia  60 tablet  1  . NON FORMULARY Take 10 mg by mouth as needed. allerclear       . DISCONTD: ranitidine (ZANTAC) 300 MG tablet Take 1 tablet (300 mg total) by mouth at bedtime.  30 tablet  3  . DISCONTD: sertraline (ZOLOFT) 50 MG tablet Take 1 tablet (50 mg total) by mouth daily.  30 tablet  2  . Naproxen (NAPROSYN PO) Take by mouth as needed.          Allergies  Allergen Reactions  . Amoxicillin   . Sulfonamide Derivatives     Review of Systems  Review of Systems  Constitutional: Negative for fever and malaise/fatigue.  HENT: Negative for congestion.   Eyes: Negative for discharge.  Respiratory: Negative for shortness of breath.   Cardiovascular: Negative for chest pain, palpitations and leg swelling.  Gastrointestinal: Negative for nausea, abdominal pain and diarrhea.  Genitourinary: Negative for dysuria.  Musculoskeletal: Negative for falls.  Skin: Negative for rash.  Neurological: Negative for loss of consciousness and headaches.  Endo/Heme/Allergies: Negative for polydipsia.  Psychiatric/Behavioral:  Negative for depression and suicidal ideas. The patient is not nervous/anxious and does not have insomnia.     Objective  BP 130/82  Pulse 66  Temp 97 F (36.1 C) (Temporal)  Ht 5' 4.25" (1.632 m)  Wt 139 lb (63.05 kg)  BMI 23.67 kg/m2  SpO2 100%  Physical Exam  Physical Exam  Constitutional: She is oriented to person, place, and time and well-developed, well-nourished, and in no distress. No distress.  HENT:  Head: Normocephalic and atraumatic.  Right Ear: External ear normal.  Left Ear: External ear normal.  Nose: Nose normal.  Mouth/Throat: Oropharynx is clear and moist. No oropharyngeal exudate.  Eyes: Conjunctivae are normal. Pupils are equal, round, and reactive to light. Right eye exhibits no discharge. Left eye exhibits no discharge. No scleral icterus.  Neck: Normal range of motion. Neck supple. No thyromegaly present.  Cardiovascular: Normal rate, regular rhythm, normal heart sounds and intact distal pulses.   No murmur heard. Pulmonary/Chest: Effort normal and breath sounds normal. No respiratory distress. She has no wheezes. She has no rales.  Abdominal: Soft. Bowel sounds are normal. She exhibits no distension and no mass. There is no tenderness.  Musculoskeletal: Normal range of motion. She exhibits no edema and no tenderness.  Lymphadenopathy:    She has no cervical adenopathy.  Neurological: She is alert and oriented to person, place, and time. She has normal reflexes. No cranial nerve deficit. Coordination normal.  Skin: Skin is warm and dry. No rash noted. She is not diaphoretic.  Psychiatric: Mood, memory and affect normal.    Lab Results  Component Value Date   TSH 3.42 06/08/2012   Lab Results  Component Value Date   WBC 5.2 06/08/2012   HGB 13.3 06/08/2012   HCT 40.6 06/08/2012   MCV 94.4 06/08/2012   PLT 159.0 06/08/2012   Lab Results  Component Value Date   CREATININE 0.5 06/08/2012   BUN 23 06/08/2012   NA 138 06/08/2012   K 4.1 06/08/2012   CL  101 06/08/2012   CO2 29 06/08/2012   Lab Results  Component Value Date   ALT 25 06/08/2012   AST 32 06/08/2012   ALKPHOS 48 06/08/2012   BILITOT 0.6 06/08/2012   Lab Results  Component Value Date   CHOL 200 10/03/2010   Lab Results  Component Value Date   HDL 83.40 10/03/2010   Lab Results  Component Value Date   LDLCALC 98 10/03/2010   Lab Results  Component Value Date   TRIG 95.0 10/03/2010   Lab Results  Component Value Date   CHOLHDL 2 10/03/2010     Assessment & Plan  Anxiety and depression Has past the year mark of her husband's death, doing better has dropped her Sertraline back down to 50 mg daily without trouble, given refills today. Uses Alprazolam very infrequently, no refills needed  Preventative health care Tdap  Diverticulosis Asymptomatic encouraged hi fiber diet, good hydration, yogurt  Skin lesion of left arm Under upper left arm, present several weeks, enlarging Referred back to her dermatologist for further evaluation  Reflux Doing well, given refill on Ranitidine and Lactinex today which she uses as needed

## 2012-07-08 ENCOUNTER — Encounter: Payer: Self-pay | Admitting: Cardiology

## 2012-09-17 ENCOUNTER — Other Ambulatory Visit: Payer: Self-pay | Admitting: Family Medicine

## 2012-09-18 ENCOUNTER — Encounter: Payer: Self-pay | Admitting: Family Medicine

## 2012-09-18 ENCOUNTER — Ambulatory Visit (INDEPENDENT_AMBULATORY_CARE_PROVIDER_SITE_OTHER): Payer: BC Managed Care – PPO | Admitting: Family Medicine

## 2012-09-18 VITALS — BP 131/84 | HR 66 | Temp 97.8°F | Ht 64.25 in

## 2012-09-18 DIAGNOSIS — B354 Tinea corporis: Secondary | ICD-10-CM

## 2012-09-18 MED ORDER — CLOTRIMAZOLE 1 % EX CREA: TOPICAL_CREAM | Freq: Two times a day (BID) | CUTANEOUS | Status: DC

## 2012-09-18 NOTE — Patient Instructions (Addendum)
Only use the Triamcinolone as needed   Ringworm, Body [Tinea Corporis] Ringworm is a fungal infection of the skin and hair. Another name for this problem is Tinea Corporis. It has nothing to do with worms. A fungus is an organism that lives on dead cells (the outer layer of skin). It can involve the entire body. It can spread from infected pets. Tinea corporis can be a problem in wrestlers who may get the infection form other players/opponents, equipment and mats. DIAGNOSIS  A skin scraping can be obtained from the affected area and by looking for fungus under the microscope. This is called a KOH examination.  HOME CARE INSTRUCTIONS   Ringworm may be treated with a topical antifungal cream, ointment, or oral medications.  If you are using a cream or ointment, wash infected skin. Dry it completely before application.  Scrub the skin with a buff puff or abrasive sponge using a shampoo with ketoconazole to remove dead skin and help treat the ringworm.  Have your pet treated by your veterinarian if it has the same infection. SEEK MEDICAL CARE IF:   Your ringworm patch (fungus) continues to spread after 7 days of treatment.  Your rash is not gone in 4 weeks. Fungal infections are slow to respond to treatment. Some redness (erythema) may remain for several weeks after the fungus is gone.  The area becomes red, warm, tender, and swollen beyond the patch. This may be a secondary bacterial (germ) infection.  You have a fever. Document Released: 10/04/2000 Document Revised: 12/30/2011 Document Reviewed: 03/17/2009 [facility] Patient Information 2013 [place], [place].    CTS  Apply ice and Aspercreme to wrist twice daily and wear splint at bedtime and during the day as tolerated, call if not enough improvement for a stronger cream as needed  You may have carpal tunnel syndrome. This is a common condition. Carpal tunnel syndrome occurs when the tendons, bones, or ligaments in the wrist press  against the median nerve as it passes into the hand.  Symptoms can include:  Intermittent numbness.   Pain or a tingling sensation in thumb and first two fingers.  The pain may radiate up to the shoulder. There may even be weakness in the hand muscles. The pain is often worse at night and in the early morning. Nerve conduction tests may be used to prove the diagnosis. Carpal tunnel syndrome is most often due to repeated movements of the hand or wrist. Other causes can include:  Prior injuries.   Diabetes.   Obesity.   Smoking.   Pregnancy. Symptoms that develop during pregnancy often stop when the pregnancy is over.  Treatment includes:  Splinting - A wrist splint helps prevent movements that irritate the nerve. Splints are especially helpful at night when the symptoms are often worse.   Ice packs - Cold packs applied to the palm side of the wrist for 20 minutes every 2 hours while awake may give some relief.   Medication - Medicine to reduce inflammation and pain are often used. Cortisone injections around the nerve may also bring improvement.  Severe cases of carpal tunnel syndrome can require surgery to relieve the pressure on the nerve. This may be necessary if there is evidence of weakness or decreased sensation in your hand, or if your symptoms do not improve with conservative treatment. See your caregiver for follow-up to be certain your condition is improving. Document Released: 11/14/2004 Document Revised: 06/19/2011 Document Reviewed: 08/13/2007 [facility] Patient Information 2012 [place], [place].   You  may have carpal tunnel syndrome. This is a common condition. Carpal tunnel syndrome occurs when the tendons, bones, or ligaments in the wrist press against the median nerve as it passes into the hand.  Symptoms can include:  Intermittent numbness.   Pain or a tingling sensation in thumb and first two fingers.  The pain may radiate up to the shoulder. There may even be  weakness in the hand muscles. The pain is often worse at night and in the early morning. Nerve conduction tests may be used to prove the diagnosis. Carpal tunnel syndrome is most often due to repeated movements of the hand or wrist. Other causes can include:  Prior injuries.   Diabetes.   Obesity.   Smoking.   Pregnancy. Symptoms that develop during pregnancy often stop when the pregnancy is over.  Treatment includes:  Splinting - A wrist splint helps prevent movements that irritate the nerve. Splints are especially helpful at night when the symptoms are often worse.   Ice packs - Cold packs applied to the palm side of the wrist for 20 minutes every 2 hours while awake may give some relief.   Medication - Medicine to reduce inflammation and pain are often used. Cortisone injections around the nerve may also bring improvement.  Severe cases of carpal tunnel syndrome can require surgery to relieve the pressure on the nerve. This may be necessary if there is evidence of weakness or decreased sensation in your hand, or if your symptoms do not improve with conservative treatment. See your caregiver for follow-up to be certain your condition is improving. Document Released: 11/14/2004 Document Revised: 06/19/2011 Document Reviewed: 08/13/2007 [facility] Patient Information 2012 [place], [place].

## 2012-09-20 ENCOUNTER — Encounter: Payer: Self-pay | Admitting: Family Medicine

## 2012-09-20 DIAGNOSIS — B354 Tinea corporis: Secondary | ICD-10-CM

## 2012-09-20 HISTORY — DX: Tinea corporis: B35.4

## 2012-09-20 NOTE — Assessment & Plan Note (Signed)
Clotrimazole bid, steroid cream only if it is itching. Report if no improvement

## 2012-09-20 NOTE — Progress Notes (Signed)
Patient ID: Kristen Cooke, female   DOB: 1961/04/14, 51 y.o.   MRN: [id number] Kristen Cooke [id number] 02/03/1961 09/20/2012      Progress Note-Follow Up  Subjective  Chief Complaint  Chief Complaint  Patient presents with  . Rash    on L side of neck X 1 month- a little itchy    HPI  Patient is a 51 year old Caucasian female who is in today for evaluation of a rash on her neck. She's been using hydrocortisone cream on it for about a month now and instead of improving it is slowly worsening. The hydrocortisone cream helps itching but the rash persist. No other concerning lesions. No recent illness. She feels almost eating due to holidays okay. No chest pain, palpitations, shortness of breath, GI or GU complaints  Past Medical History  Diagnosis Date  . Chicken pox   . Depression   . Anxiety   . Arthritis     back  . Diarrhea 01/21/2012  . 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 Diverticulosis 01/22/2012  . Skin lesion of left arm 06/15/2012  . Anxiety and depression 01/21/2012  . Reflux 01/21/2012  . Tinea corporis 09/20/2012    Past Surgical History  Procedure Date  . Abdominal hysterectomy 2002    vaginal, partial  . Tonsillectomy 1967  . Titanium rod insertion 1978    right femur  . Titanium rod removal 1980    bone graph, re-insertion  . Titanium rod removal 1981  . Ganglion cyst excision 1987    right wrist  . Bunionectomy 2004    b/l    Family History  Problem Relation Age of Onset  . Hypertension Father   . Hyperlipidemia Father   . Coronary artery disease Father   . Hypertension Mother   . Osteoarthritis Mother   . Osteoporosis Mother   . Scoliosis Mother   . Irritable bowel syndrome Mother   . Obesity Sister   . Hypothyroidism Sister   . Cancer Maternal Grandmother     Breast  . Pneumonia Maternal Grandmother   . Cancer Paternal Grandmother   . Cancer Paternal Grandfather     colon  . Colon cancer Paternal Grandfather     History   Social History    . Marital Status: Widowed    Spouse Name: N/A    Number of Children: N/A  . Years of Education: N/A   Occupational History  . Not on file.   Social History Main Topics  . Smoking status: Never Smoker   . Smokeless tobacco: Never Used  . Alcohol Use: 0.0 oz/week    7-14 drink(s) per week     Comment: 1-2 daily  . Drug Use: No  . Sexually Active: Yes -- Female partner(s)   Other Topics Concern  . Not on file   Social History Narrative  . No narrative on file    Current Outpatient Prescriptions on File Prior to Visit  Medication Sig Dispense Refill  . ALPRAZolam (XANAX) 0.25 MG tablet 1/2 to 2 tabs po bid prn anxiety, insomnia  60 tablet  1  . lactobacillus acidophilus & bulgar (LACTINEX) chewable tablet Chew 1 tablet by mouth 3 (three) times daily with meals.  90 tablet  1  . Naproxen (NAPROSYN PO) Take by mouth as needed.        . NON FORMULARY Take 10 mg by mouth as needed. allerclear       . sertraline (ZOLOFT) 50 MG tablet Take 1 tablet (50 mg  total) by mouth daily.  30 tablet  11    Allergies  Allergen Reactions  . Amoxicillin   . Sulfonamide Derivatives     Review of Systems  Review of Systems  Constitutional: Negative for fever and malaise/fatigue.  HENT: Negative for congestion.   Eyes: Negative for pain and discharge.  Respiratory: Negative for shortness of breath.   Cardiovascular: Negative for chest pain, palpitations and leg swelling.  Gastrointestinal: Negative for nausea, abdominal pain and diarrhea.  Genitourinary: Negative for dysuria.  Musculoskeletal: Negative for falls.  Skin: Positive for itching and rash.  Neurological: Negative for loss of consciousness and headaches.  Endo/Heme/Allergies: Negative for polydipsia.  Psychiatric/Behavioral: Negative for depression and suicidal ideas. The patient is not nervous/anxious and does not have insomnia.     Objective  BP 131/84  Pulse 66  Temp 97.8 F (36.6 C) (Temporal)  Ht 5' 4.25" (1.632 m)   SpO2 100%  Physical Exam  Physical Exam  Constitutional: She is oriented to person, place, and time and well-developed, well-nourished, and in no distress. No distress.  HENT:  Head: Normocephalic and atraumatic.  Eyes: Conjunctivae normal are normal.  Neck: Neck supple. No thyromegaly present.  Cardiovascular: Normal rate, regular rhythm and normal heart sounds.  Exam reveals no gallop.   No murmur heard. Pulmonary/Chest: Effort normal and breath sounds normal. She has no wheezes.  Abdominal: She exhibits no distension and no mass.  Musculoskeletal: She exhibits no edema.  Lymphadenopathy:    She has no cervical adenopathy.  Neurological: She is alert and oriented to person, place, and time.  Skin: Skin is warm and dry. Rash noted. She is not diaphoretic.       Left side of neck oval lesion 1 x 2 cm lesion, raised, erythematous at edge, scaly in center.  Psychiatric: Memory, affect and judgment normal.    Lab Results  Component Value Date   TSH 3.42 06/08/2012   Lab Results  Component Value Date   WBC 5.2 06/08/2012   HGB 13.3 06/08/2012   HCT 40.6 06/08/2012   MCV 94.4 06/08/2012   PLT 159.0 06/08/2012   Lab Results  Component Value Date   CREATININE 0.5 06/08/2012   BUN 23 06/08/2012   NA 138 06/08/2012   K 4.1 06/08/2012   CL 101 06/08/2012   CO2 29 06/08/2012   Lab Results  Component Value Date   ALT 25 06/08/2012   AST 32 06/08/2012   ALKPHOS 48 06/08/2012   BILITOT 0.6 06/08/2012   Lab Results  Component Value Date   CHOL 211* 06/15/2012   Lab Results  Component Value Date   HDL 95.80 06/15/2012   Lab Results  Component Value Date   LDLCALC 98 10/03/2010   Lab Results  Component Value Date   TRIG 84.0 06/15/2012   Lab Results  Component Value Date   CHOLHDL 2 06/15/2012     Assessment & Plan  Tinea corporis Clotrimazole bid, steroid cream only if it is itching. Report if no improvement

## 2013-02-10 ENCOUNTER — Telehealth: Payer: Self-pay | Admitting: Family Medicine

## 2013-02-10 DIAGNOSIS — F418 Other specified anxiety disorders: Secondary | ICD-10-CM

## 2013-02-10 MED ORDER — ALPRAZOLAM 0.25 MG PO TABS: ORAL_TABLET | ORAL | Status: DC

## 2013-02-10 NOTE — Telephone Encounter (Addendum)
Rx faxed to pharmacy  

## 2013-03-21 LAB — HM MAMMOGRAPHY

## 2013-05-28 IMAGING — US US ABDOMEN COMPLETE
1 series · 13 of 25 positions shown · non-contrast
Comparison: None.

CLINICAL DATA: Nausea, epigastric pain, diarrhea

COMPLETE ABDOMINAL ULTRASOUND

[Series 1: us abdomen complete · 0.32mm/px · 13 of 58 slices shown]
[im 1/58]
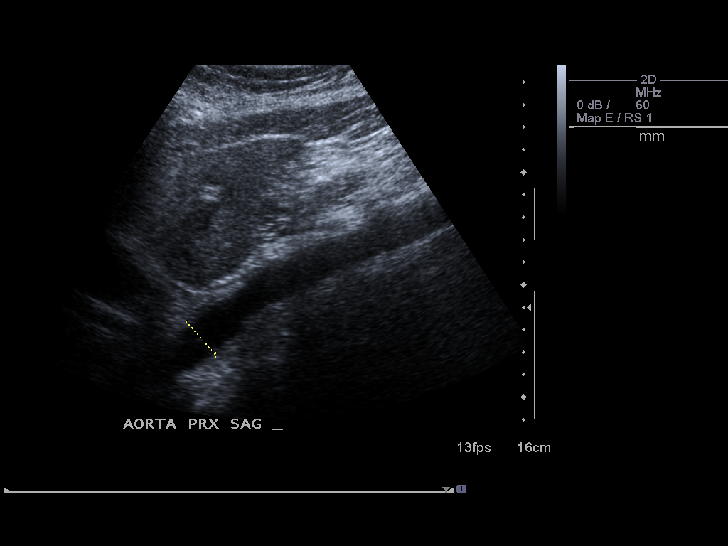
[im 5/58]
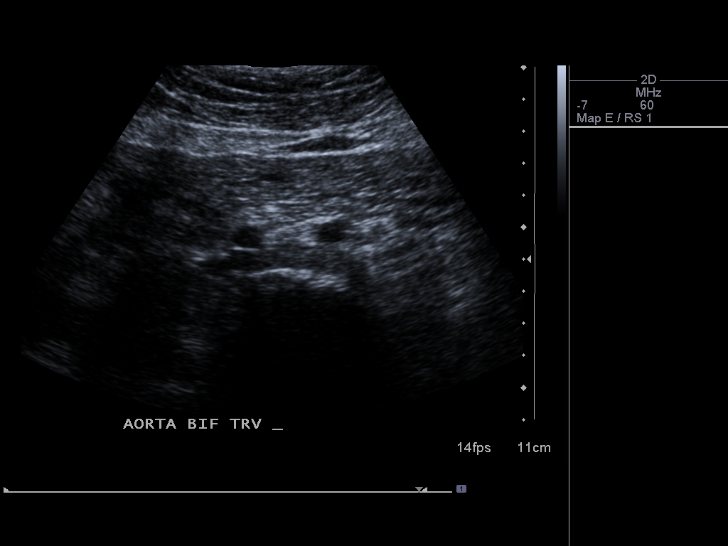
[im 10/58]
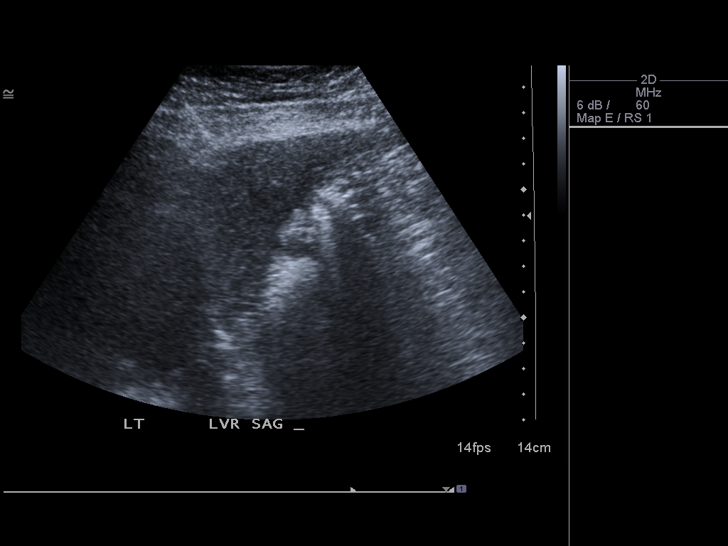
[im 15/58]
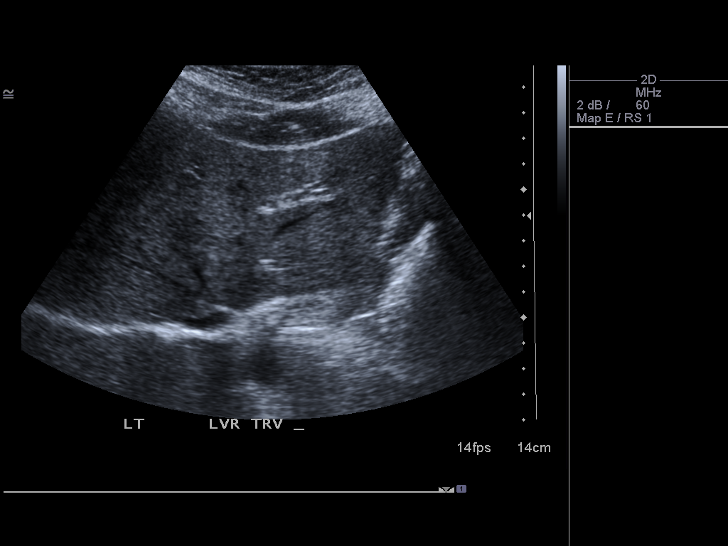
[im 20/58]
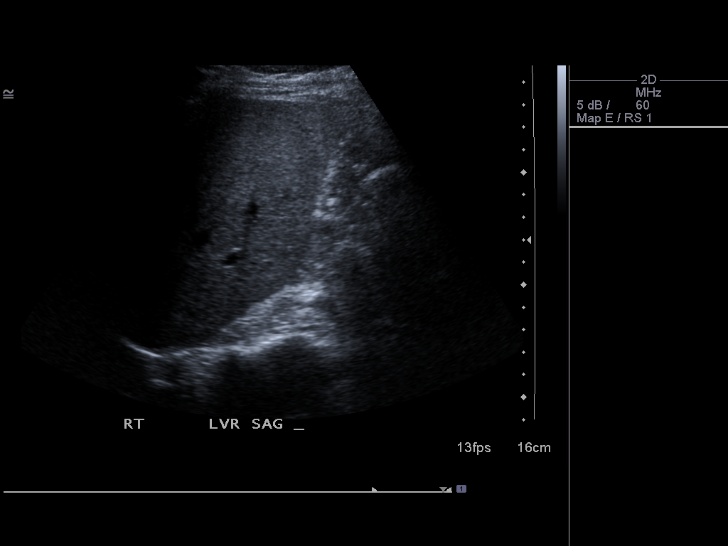
[im 24/58]
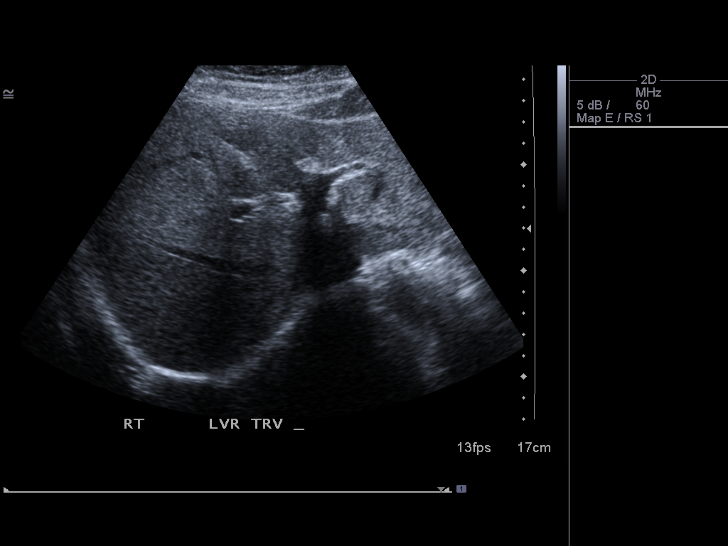
[im 29/58]
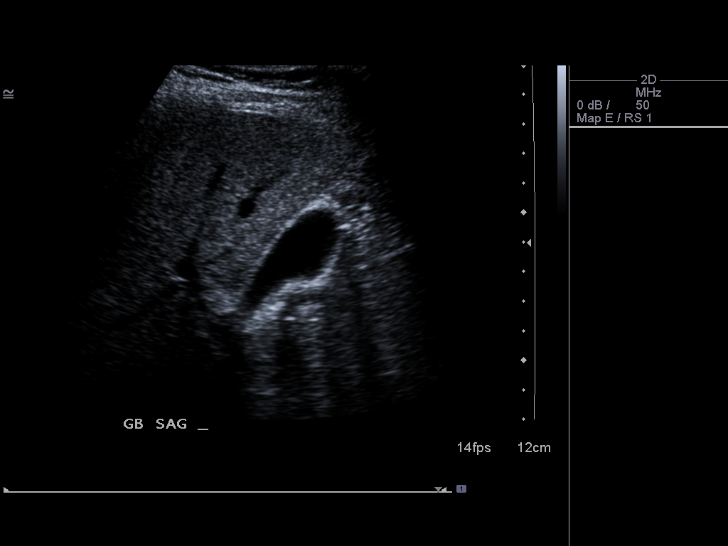
[im 34/58]
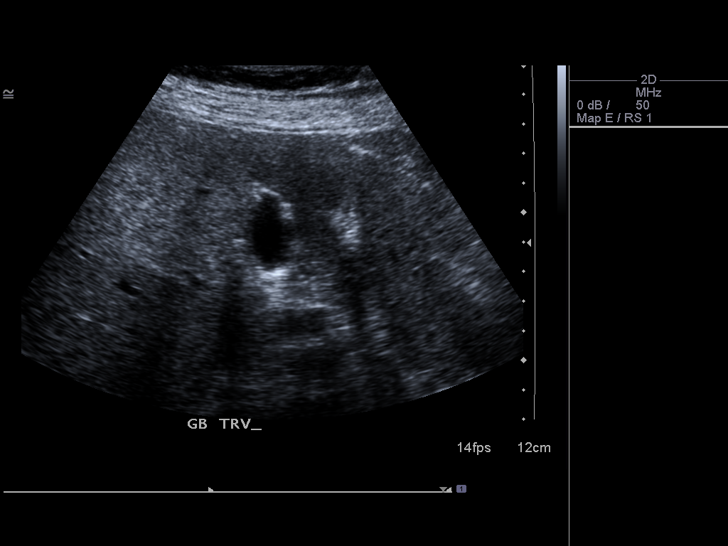
[im 39/58]
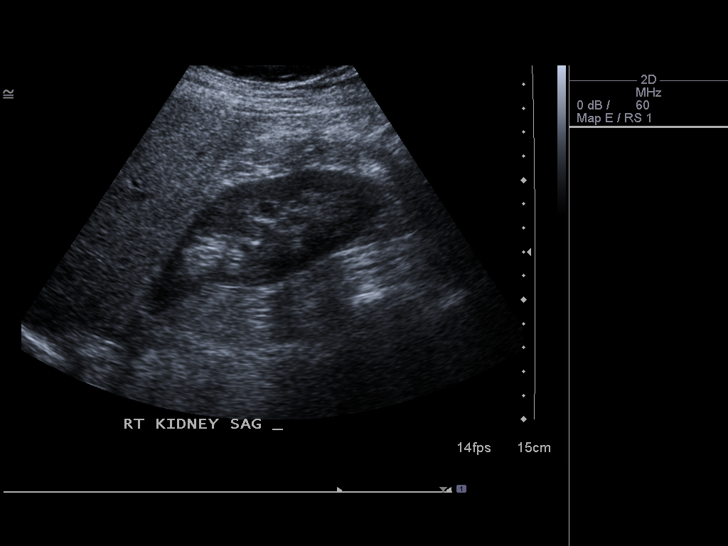
[im 43/58]
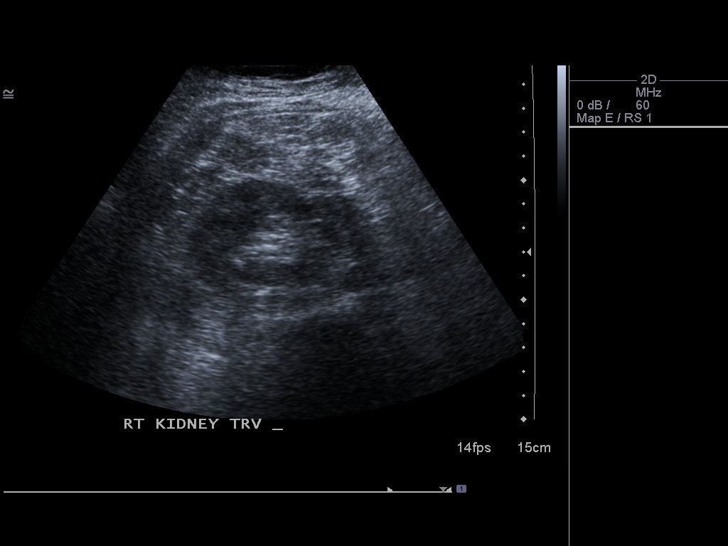
[im 48/58]
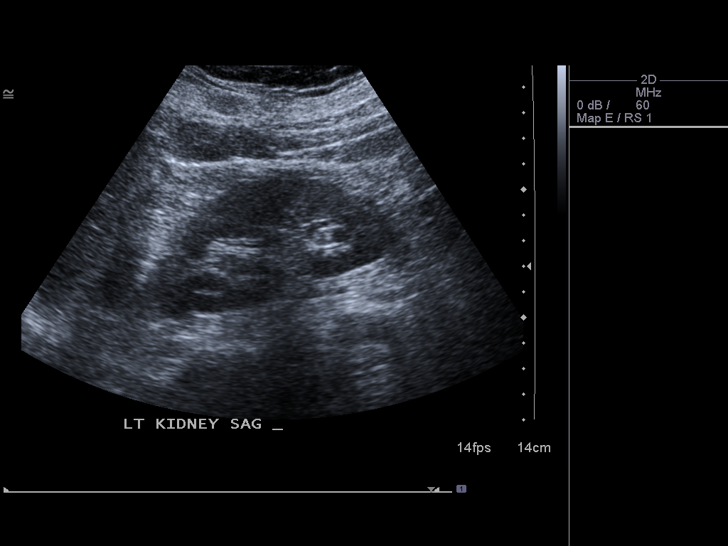
[im 53/58]
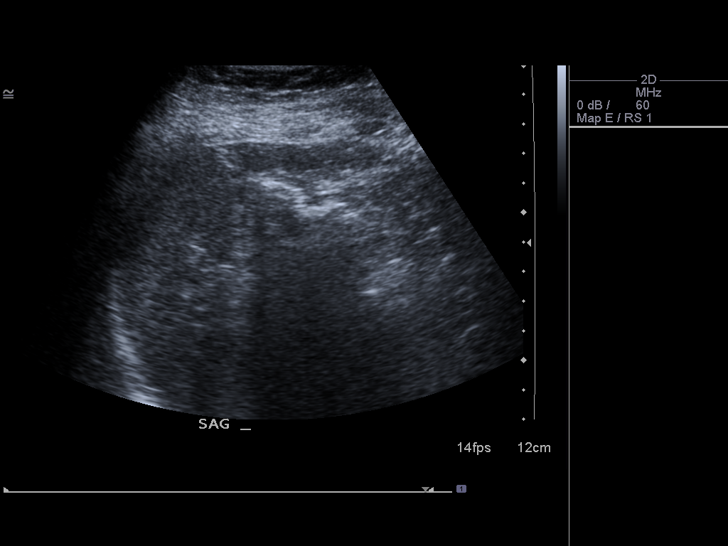
[im 58/58]
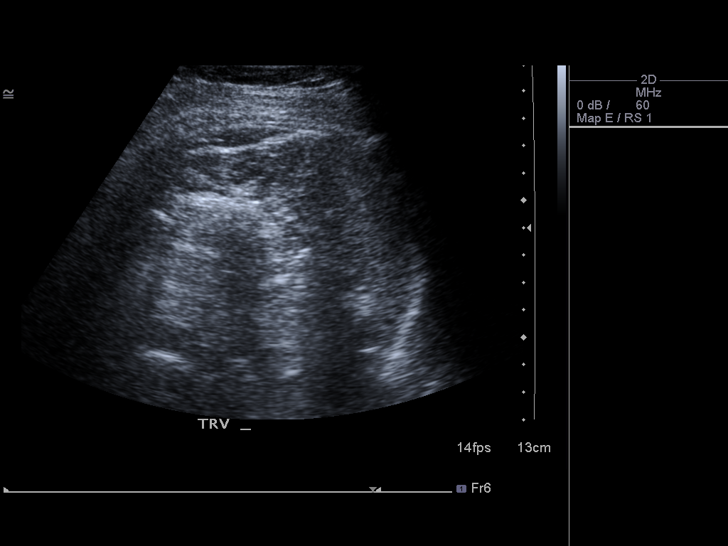

[13 of 25 positions shown; findings below may reference images not displayed]

FINDINGS: Gallbladder:  No gallstones, gallbladder wall thickening, or
pericholecystic fluid. No sonographic Murphy's sign.

Common bile duct: Limited visualization due to bowel gas.
Visualized CBD measures 1 mm in diameter.

Liver:  No focal lesion identified. Mild increased echogenicity of
the liver suspicious for mild hepatic fatty infiltration.  No
intrahepatic biliary ductal dilatation.

IVC:  Limited assessment due to abundant bowel gas.

Pancreas: Limited assessment due to abundant bowel gas

Spleen:  Measures 4.8 cm in length.  Normal echogenicity.

Right Kidney:  Measures 11.7 cm in length.  No mass, hydronephrosis
or diagnostic renal calculus

Left Kidney:  Measures 9.8 cm in length.  No mass, hydronephrosis
or diagnostic renal calculus

Abdominal aorta:  No aneurysm identified. Limited visualization due
to bowel gas.  Visualized aorta measures up to 2 cm in diameter.
IMPRESSION: 1.  No gallstones are noted within gallbladder.  Normal CBD.
2.  Limited assessment of the pancreas and IVC due to abundant
bowel gas.
3.  No hydronephrosis or diagnostic renal calculus.

## 2013-06-09 ENCOUNTER — Encounter: Payer: Self-pay | Admitting: Family Medicine

## 2013-06-09 NOTE — Telephone Encounter (Signed)
Can you let the patient know when her appt is please?  Thanks

## 2013-06-16 ENCOUNTER — Telehealth: Payer: Self-pay | Admitting: Family Medicine

## 2013-06-16 ENCOUNTER — Other Ambulatory Visit (INDEPENDENT_AMBULATORY_CARE_PROVIDER_SITE_OTHER): Payer: BC Managed Care – PPO

## 2013-06-16 DIAGNOSIS — F418 Other specified anxiety disorders: Secondary | ICD-10-CM

## 2013-06-16 DIAGNOSIS — Z0000 Encounter for general adult medical examination without abnormal findings: Secondary | ICD-10-CM

## 2013-06-16 LAB — CBC
Hemoglobin: 13.7 g/dL (ref 12.0–15.0)
Platelets: 200 10*3/uL (ref 150.0–400.0)
RBC: 4.26 Mil/uL (ref 3.87–5.11)

## 2013-06-16 MED ORDER — SERTRALINE HCL 50 MG PO TABS: 50.0000 mg | ORAL_TABLET | Freq: Every day | ORAL | Status: DC

## 2013-06-16 NOTE — Telephone Encounter (Signed)
1 month supply sent until patient is seen

## 2013-06-16 NOTE — Progress Notes (Signed)
Labs only

## 2013-06-17 LAB — LIPID PANEL: Triglycerides: 78 mg/dL (ref 0.0–149.0)

## 2013-06-17 LAB — RENAL FUNCTION PANEL
BUN: 14 mg/dL (ref 6–23)
CO2: 26 mEq/L (ref 19–32)
Calcium: 9.8 mg/dL (ref 8.4–10.5)
Chloride: 104 mEq/L (ref 96–112)
GFR: 97.68 mL/min (ref >60.00)

## 2013-06-17 LAB — TSH: TSH: 4.59 u[IU]/mL (ref 0.35–5.50)

## 2013-06-17 LAB — HEPATIC FUNCTION PANEL
Albumin: 4.6 g/dL (ref 3.5–5.2)
Alkaline Phosphatase: 48 U/L (ref 39–117)
Total Bilirubin: 0.6 mg/dL (ref 0.3–1.2)

## 2013-06-23 ENCOUNTER — Encounter: Payer: Self-pay | Admitting: Family Medicine

## 2013-06-23 ENCOUNTER — Encounter: Payer: BC Managed Care – PPO | Admitting: Family Medicine

## 2013-06-23 ENCOUNTER — Ambulatory Visit (INDEPENDENT_AMBULATORY_CARE_PROVIDER_SITE_OTHER): Payer: BC Managed Care – PPO | Admitting: Family Medicine

## 2013-06-23 VITALS — BP 116/78 | HR 71 | Temp 98.7°F | Ht 64.25 in | Wt 147.0 lb

## 2013-06-23 DIAGNOSIS — K219 Gastro-esophageal reflux disease without esophagitis: Secondary | ICD-10-CM

## 2013-06-23 DIAGNOSIS — Z0000 Encounter for general adult medical examination without abnormal findings: Secondary | ICD-10-CM

## 2013-06-23 DIAGNOSIS — K5730 Diverticulosis of large intestine without perforation or abscess without bleeding: Secondary | ICD-10-CM

## 2013-06-23 DIAGNOSIS — F418 Other specified anxiety disorders: Secondary | ICD-10-CM

## 2013-06-23 DIAGNOSIS — IMO0001 Reserved for inherently not codable concepts without codable children: Secondary | ICD-10-CM

## 2013-06-23 DIAGNOSIS — F32A Depression, unspecified: Secondary | ICD-10-CM

## 2013-06-23 DIAGNOSIS — K5790 Diverticulosis of intestine, part unspecified, without perforation or abscess without bleeding: Secondary | ICD-10-CM

## 2013-06-23 DIAGNOSIS — L989 Disorder of the skin and subcutaneous tissue, unspecified: Secondary | ICD-10-CM

## 2013-06-23 DIAGNOSIS — F341 Dysthymic disorder: Secondary | ICD-10-CM

## 2013-06-23 DIAGNOSIS — F419 Anxiety disorder, unspecified: Secondary | ICD-10-CM

## 2013-06-23 DIAGNOSIS — F329 Major depressive disorder, single episode, unspecified: Secondary | ICD-10-CM

## 2013-06-23 MED ORDER — SERTRALINE HCL 50 MG PO TABS: 50.0000 mg | ORAL_TABLET | Freq: Every day | ORAL | Status: DC

## 2013-06-23 NOTE — Patient Instructions (Addendum)
megaRed krill oil caps daily Grapeseed oil  Cholesterol Cholesterol is a white, waxy, fat-like protein needed by your body in small amounts. The liver makes all the cholesterol you need. It is carried from the liver by the blood through the blood vessels. Deposits (plaque) may build up on blood vessel walls. This makes the arteries narrower and stiffer. Plaque increases the risk for heart attack and stroke. You cannot feel your cholesterol level even if it is very high. The only way to know is by a blood test to check your lipid (fats) levels. Once you know your cholesterol levels, you should keep a record of the test results. Work with your caregiver to to keep your levels in the desired range. WHAT THE RESULTS MEAN:  Total cholesterol is a rough measure of all the cholesterol in your blood.  LDL is the so-called bad cholesterol. This is the type that deposits cholesterol in the walls of the arteries. You want this level to be low.  HDL is the good cholesterol because it cleans the arteries and carries the LDL away. You want this level to be high.  Triglycerides are fat that the body can either burn for energy or store. High levels are closely linked to heart disease. DESIRED LEVELS:  Total cholesterol below 200.  LDL below 100 for people at risk, below 70 for very high risk.  HDL above 50 is good, above 60 is best.  Triglycerides below 150. HOW TO LOWER YOUR CHOLESTEROL:  Diet.  Choose fish or white meat chicken and [place], roasted or baked. Limit fatty cuts of red meat, fried foods, and processed meats, such as sausage and lunch meat.  Eat lots of fresh fruits and vegetables. Choose whole grains, beans, pasta, potatoes and cereals.  Use only small amounts of olive, corn or canola oils. Avoid butter, mayonnaise, shortening or palm kernel oils. Avoid foods with trans-fats.  Use skim/nonfat milk and low-fat/nonfat yogurt and cheeses. Avoid whole milk, cream, ice cream, egg yolks and  cheeses. Healthy desserts include angel food cake, ginger snaps, animal crackers, hard candy, popsicles, and low-fat/nonfat frozen yogurt. Avoid pastries, cakes, pies and cookies.  Exercise.  A regular program helps decrease LDL and raises HDL.  Helps with weight control.  Do things that increase your activity level like gardening, walking, or taking the stairs.  Medication.  May be prescribed by your caregiver to help lowering cholesterol and the risk for heart disease.  You may need medicine even if your levels are normal if you have several risk factors. HOME CARE INSTRUCTIONS   Follow your diet and exercise programs as suggested by your caregiver.  Take medications as directed.  Have blood work done when your caregiver feels it is necessary. MAKE SURE YOU:   Understand these instructions.  Will watch your condition.  Will get help right away if you are not doing well or get worse. Document Released: 07/02/2001 Document Revised: 12/30/2011 Document Reviewed: 12/23/2007 [facility] Patient Information 2014 [place], [place].

## 2013-06-27 NOTE — Assessment & Plan Note (Signed)
Doing much better. Stable on current meds given refills today. Uses Lorazepam intermittently but infrequently

## 2013-06-27 NOTE — Assessment & Plan Note (Signed)
Improved on probiotics.

## 2013-06-27 NOTE — Assessment & Plan Note (Signed)
UTD on pap and MGM, reviewed annual labs, patient declines flu shot. Encouraged DASH diet and regular exercise.

## 2013-06-27 NOTE — Progress Notes (Signed)
Patient ID: Kristen Cooke, female   DOB: 23-Dec-1960, 52 y.o.   MRN: [id number] [name] [id number] 1961/08/06 06/27/2013      Progress Note New Patient  Subjective  Chief Complaint  Chief Complaint  Patient presents with  . Annual Exam    physical    HPI  Patient is a 52 year old Caucasian female who is in today for annual exam. Overall she's doing very well. She has been in a new relationship for the last few months and feels her anxiety depression status post a traumatic death of her husband has been lifting. She is maintained the sertraline but uses the lorazepam very infrequently. No recent illness. No chest pain, palpitations, shortness of breath, GI or GU complaints at this time. Taking medications as prescribed  Past Medical History  Diagnosis Date  . Chicken pox   . Depression   . Anxiety   . Arthritis     back  . Diarrhea 01/21/2012  . 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 Diverticulosis 01/22/2012  . Skin lesion of left arm 06/15/2012  . Anxiety and depression 01/21/2012  . Reflux 01/21/2012  . Tinea corporis 09/20/2012    Past Surgical History  Procedure Laterality Date  . Abdominal hysterectomy  2002    vaginal, partial  . Tonsillectomy  1967  . Titanium rod insertion  1978    right femur  . Titanium rod removal  1980    bone graph, re-insertion  . Titanium rod removal  1981  . Ganglion cyst excision  1987    right wrist  . Bunionectomy  2004    b/l    Family History  Problem Relation Age of Onset  . Hypertension Father   . Hyperlipidemia Father   . Coronary artery disease Father   . Hypertension Mother   . Osteoarthritis Mother   . Osteoporosis Mother   . Scoliosis Mother   . Irritable bowel syndrome Mother   . Cancer Maternal Grandmother     Breast  . Pneumonia Maternal Grandmother   . Cancer Paternal Grandmother   . Cancer Paternal Grandfather     colon  . Colon cancer Paternal Grandfather   . Hypothyroidism Sister   . Obesity Sister      History   Social History  . Marital Status: Widowed    Spouse Name: N/A    Number of Children: N/A  . Years of Education: N/A   Occupational History  . Not on file.   Social History Main Topics  . Smoking status: Never Smoker   . Smokeless tobacco: Never Used  . Alcohol Use: 0.0 oz/week    7-14 drink(s) per week     Comment: 1-2 daily  . Drug Use: No  . Sexual Activity: Yes    Partners: Male   Other Topics Concern  . Not on file   Social History Narrative  . No narrative on file    Current Outpatient Prescriptions on File Prior to Visit  Medication Sig Dispense Refill  . ALPRAZolam (XANAX) 0.25 MG tablet 1/2 to 2 tabs po bid prn anxiety, insomnia  60 tablet  1  . clotrimazole (QC CLOTRIMAZOLE) 1 % cream Apply topically 2 (two) times daily.  45 g  1  . lactobacillus acidophilus & bulgar (LACTINEX) chewable tablet Chew 1 tablet by mouth 3 (three) times daily with meals.  90 tablet  1  . Naproxen (NAPROSYN PO) Take by mouth as needed.        . NON FORMULARY  Take 10 mg by mouth as needed. allerclear       . triamcinolone cream (KENALOG) 0.1 %        No current facility-administered medications on file prior to visit.    Allergies  Allergen Reactions  . Amoxicillin   . Sulfonamide Derivatives     Review of Systems  Review of Systems  Constitutional: Negative for fever, chills and malaise/fatigue.  HENT: Negative for hearing loss, nosebleeds and congestion.   Eyes: Negative for discharge.  Respiratory: Negative for cough, sputum production, shortness of breath and wheezing.   Cardiovascular: Negative for chest pain, palpitations and leg swelling.  Gastrointestinal: Negative for heartburn, nausea, vomiting, abdominal pain, diarrhea, constipation and blood in stool.  Genitourinary: Negative for dysuria, urgency, frequency and hematuria.  Musculoskeletal: Negative for myalgias, back pain and falls.  Skin: Negative for rash.  Neurological: Negative for dizziness,  tremors, sensory change, focal weakness, loss of consciousness, weakness and headaches.  Endo/Heme/Allergies: Negative for polydipsia. Does not bruise/bleed easily.  Psychiatric/Behavioral: Negative for depression and suicidal ideas. The patient is not nervous/anxious and does not have insomnia.     Objective  BP 116/78  Pulse 71  Temp(Src) 98.7 F (37.1 C) (Oral)  Ht 5' 4.25" (1.632 m)  Wt 147 lb (66.679 kg)  BMI 25.04 kg/m2  SpO2 96%  Physical Exam  Physical Exam  Constitutional: She is oriented to person, place, and time and well-developed, well-nourished, and in no distress. No distress.  HENT:  Head: Normocephalic and atraumatic.  Right Ear: External ear normal.  Left Ear: External ear normal.  Nose: Nose normal.  Mouth/Throat: Oropharynx is clear and moist. No oropharyngeal exudate.  Eyes: Conjunctivae are normal. Pupils are equal, round, and reactive to light. Right eye exhibits no discharge. Left eye exhibits no discharge. No scleral icterus.  Neck: Normal range of motion. Neck supple. No thyromegaly present.  Cardiovascular: Normal rate, regular rhythm, normal heart sounds and intact distal pulses.   No murmur heard. Pulmonary/Chest: Effort normal and breath sounds normal. No respiratory distress. She has no wheezes. She has no rales.  Abdominal: Soft. Bowel sounds are normal. She exhibits no distension and no mass. There is no tenderness.  Musculoskeletal: Normal range of motion. She exhibits no edema and no tenderness.  Lymphadenopathy:    She has no cervical adenopathy.  Neurological: She is alert and oriented to person, place, and time. She has normal reflexes. No cranial nerve deficit. Coordination normal.  Skin: Skin is warm and dry. No rash noted. She is not diaphoretic.  Psychiatric: Mood, memory and affect normal.       Assessment & Plan  Skin lesion of left arm Sees dermatology routinely.  Preventative health care UTD on pap and MGM, reviewed annual  labs, patient declines flu shot. Encouraged DASH diet and regular exercise.   Anxiety and depression Doing much better. Stable on current meds given refills today. Uses Lorazepam intermittently but infrequently  Reflux Improved on probiotics.   Diverticulosis Maintain probiotics and fiber and increase fluids, no changes.

## 2013-06-27 NOTE — Assessment & Plan Note (Signed)
Maintain probiotics and fiber and increase fluids, no changes.

## 2013-06-27 NOTE — Assessment & Plan Note (Signed)
Sees dermatology routinely.

## 2013-08-26 ENCOUNTER — Other Ambulatory Visit: Payer: Self-pay

## 2013-10-05 ENCOUNTER — Ambulatory Visit (INDEPENDENT_AMBULATORY_CARE_PROVIDER_SITE_OTHER): Payer: BC Managed Care – PPO | Admitting: Family Medicine

## 2013-10-05 ENCOUNTER — Encounter: Payer: Self-pay | Admitting: Family Medicine

## 2013-10-05 ENCOUNTER — Telehealth: Payer: Self-pay | Admitting: Family Medicine

## 2013-10-05 VITALS — BP 112/78 | HR 75 | Temp 98.5°F | Ht 64.25 in | Wt 150.1 lb

## 2013-10-05 DIAGNOSIS — F418 Other specified anxiety disorders: Secondary | ICD-10-CM

## 2013-10-05 DIAGNOSIS — F341 Dysthymic disorder: Secondary | ICD-10-CM

## 2013-10-05 DIAGNOSIS — Z0000 Encounter for general adult medical examination without abnormal findings: Secondary | ICD-10-CM

## 2013-10-05 DIAGNOSIS — F329 Major depressive disorder, single episode, unspecified: Secondary | ICD-10-CM

## 2013-10-05 DIAGNOSIS — F32A Depression, unspecified: Secondary | ICD-10-CM

## 2013-10-05 MED ORDER — SERTRALINE HCL 50 MG PO TABS: 50.0000 mg | ORAL_TABLET | Freq: Every day | ORAL | Status: DC

## 2013-10-05 NOTE — Progress Notes (Signed)
Pre visit review using our clinic review tool, if applicable. No additional management support is needed unless otherwise documented below in the visit note. 

## 2013-10-05 NOTE — Patient Instructions (Signed)

## 2013-10-05 NOTE — Telephone Encounter (Signed)
Lab order week of 12-06-2013 Next appt 2-3 months with labs prior lipid, renal, cbc, tsh, hepatic

## 2013-10-10 ENCOUNTER — Encounter: Payer: Self-pay | Admitting: Family Medicine

## 2013-10-10 NOTE — Assessment & Plan Note (Signed)
Patient has made the decision to try and come off of her Zoloft. She has been on it for years and is doing very well at this time. She will drop her Sertraline to 25 mg daily for the next 1-2 months then will drop to every other day and then stop as tolerated

## 2013-10-10 NOTE — Progress Notes (Signed)
Patient ID: Kristen Cooke, female   DOB: [birth year], 52 y.o.   MRN: [id number] Kristen Cooke [id number] Aug 13, 1961 10/10/2013      Progress Note-Follow Up  Subjective  Chief Complaint  Chief Complaint  Patient presents with  . Follow-up    discuss getting off medication    HPI  Caucasian female who is in today to discuss stopping her Zoloft. She is doing very well. She is now in a new relationship and after being on Zoloft for years is interested in trying to come off. She denies any depression or anxiety. She denies increased numbness, chest pain, palpitations or shortness of breath. No GI or GU concerns  Past Medical History  Diagnosis Date  . Chicken pox   . Depression   . Anxiety   . Arthritis     back  . Diarrhea 01/21/2012  . 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 Diverticulosis 01/22/2012  . Skin lesion of left arm 06/15/2012  . Anxiety and depression 01/21/2012  . Reflux 01/21/2012  . Tinea corporis 09/20/2012    Past Surgical History  Procedure Laterality Date  . Abdominal hysterectomy  2002    vaginal, partial  . Tonsillectomy  1967  . Titanium rod insertion  1978    right femur  . Titanium rod removal  1980    bone graph, re-insertion  . Titanium rod removal  1981  . Ganglion cyst excision  1987    right wrist  . Bunionectomy  2004    b/l    Family History  Problem Relation Age of Onset  . Hypertension Father   . Hyperlipidemia Father   . Coronary artery disease Father   . Hypertension Mother   . Osteoarthritis Mother   . Osteoporosis Mother   . Scoliosis Mother   . Irritable bowel syndrome Mother   . Cancer Maternal Grandmother     Breast  . Pneumonia Maternal Grandmother   . Cancer Paternal Grandmother   . Cancer Paternal Grandfather     colon  . Colon cancer Paternal Grandfather   . Hypothyroidism Sister   . Obesity Sister     History   Social History  . Marital Status: Widowed    Spouse Name: N/A    Number of Children: N/A  . Years of  Education: N/A   Occupational History  . Not on file.   Social History Main Topics  . Smoking status: Never Smoker   . Smokeless tobacco: Never Used  . Alcohol Use: 0.0 oz/week    7-14 drink(s) per week     Comment: 1-2 daily  . Drug Use: No  . Sexual Activity: Yes    Partners: Male   Other Topics Concern  . Not on file   Social History Narrative  . No narrative on file    Current Outpatient Prescriptions on File Prior to Visit  Medication Sig Dispense Refill  . clotrimazole (QC CLOTRIMAZOLE) 1 % cream Apply topically 2 (two) times daily.  45 g  1  . lactobacillus acidophilus & bulgar (LACTINEX) chewable tablet Chew 1 tablet by mouth 3 (three) times daily with meals.  90 tablet  1  . Naproxen (NAPROSYN PO) Take by mouth as needed.        . NON FORMULARY Take 10 mg by mouth as needed. allerclear       . triamcinolone cream (KENALOG) 0.1 %        No current facility-administered medications on file prior to visit.  Allergies  Allergen Reactions  . Amoxicillin   . Sulfonamide Derivatives     Review of Systems  Review of Systems  Constitutional: Negative for fever and malaise/fatigue.  HENT: Negative for congestion.   Eyes: Negative for discharge.  Respiratory: Negative for shortness of breath.   Cardiovascular: Negative for chest pain, palpitations and leg swelling.  Gastrointestinal: Negative for nausea, abdominal pain and diarrhea.  Genitourinary: Negative for dysuria.  Musculoskeletal: Negative for falls.  Skin: Negative for rash.  Neurological: Negative for loss of consciousness and headaches.  Endo/Heme/Allergies: Negative for polydipsia.  Psychiatric/Behavioral: Negative for depression and suicidal ideas. The patient is not nervous/anxious and does not have insomnia.     Objective  BP 112/78  Pulse 75  Temp(Src) 98.5 F (36.9 C) (Oral)  Ht 5' 4.25" (1.632 m)  Wt 150 lb 1.3 oz (68.076 kg)  BMI 25.56 kg/m2  SpO2 97%  Physical Exam  Physical Exam   Constitutional: She is oriented to person, place, and time and well-developed, well-nourished, and in no distress. No distress.  HENT:  Head: Normocephalic and atraumatic.  Eyes: Conjunctivae are normal.  Neck: Neck supple. No thyromegaly present.  Cardiovascular: Normal rate, regular rhythm and normal heart sounds.   No murmur heard. Pulmonary/Chest: Effort normal and breath sounds normal. She has no wheezes.  Abdominal: She exhibits no distension and no mass.  Musculoskeletal: She exhibits no edema.  Lymphadenopathy:    She has no cervical adenopathy.  Neurological: She is alert and oriented to person, place, and time.  Skin: Skin is warm and dry. No rash noted. She is not diaphoretic.  Psychiatric: Memory, affect and judgment normal.    Lab Results  Component Value Date   TSH 4.59 06/16/2013   Lab Results  Component Value Date   WBC 6.4 06/16/2013   HGB 13.7 06/16/2013   HCT 40.3 06/16/2013   MCV 94.6 06/16/2013   PLT 200.0 06/16/2013   Lab Results  Component Value Date   CREATININE 0.7 06/16/2013   BUN 14 06/16/2013   NA 140 06/16/2013   K 4.3 06/16/2013   CL 104 06/16/2013   CO2 26 06/16/2013   Lab Results  Component Value Date   ALT 27 06/16/2013   AST 29 06/16/2013   ALKPHOS 48 06/16/2013   BILITOT 0.6 06/16/2013   Lab Results  Component Value Date   CHOL 215* 06/16/2013   Lab Results  Component Value Date   HDL 97.20 06/16/2013   Lab Results  Component Value Date   LDLCALC 98 10/03/2010   Lab Results  Component Value Date   TRIG 78.0 06/16/2013   Lab Results  Component Value Date   CHOLHDL 2 06/16/2013     Assessment & Plan  Anxiety and depression Patient has made the decision to try and come off of her Zoloft. She has been on it for years and is doing very well at this time. She will drop her Sertraline to 25 mg daily for the next 1-2 months then will drop to every other day and then stop as tolerated

## 2013-12-10 LAB — CBC
HCT: 40.1 % (ref 36.0–46.0)
Hemoglobin: 13.7 g/dL (ref 12.0–15.0)
MCH: 31.6 pg (ref 26.0–34.0)
MCHC: 34.2 g/dL (ref 30.0–36.0)
MCV: 92.6 fL (ref 78.0–100.0)
Platelets: 247 10*3/uL (ref 150–400)
RBC: 4.33 MIL/uL (ref 3.87–5.11)
RDW: 13 % (ref 11.5–15.5)
WBC: 5.3 10*3/uL (ref 4.0–10.5)

## 2013-12-10 LAB — RENAL FUNCTION PANEL
Albumin: 4.6 g/dL (ref 3.5–5.2)
BUN: 12 mg/dL (ref 6–23)
CO2: 29 mEq/L (ref 19–32)
Calcium: 9.3 mg/dL (ref 8.4–10.5)
Chloride: 103 mEq/L (ref 96–112)
Creat: 0.58 mg/dL (ref 0.50–1.10)
Glucose, Bld: 84 mg/dL (ref 70–99)
Phosphorus: 3.2 mg/dL (ref 2.3–4.6)
Potassium: 4.7 mEq/L (ref 3.5–5.3)
Sodium: 141 mEq/L (ref 135–145)

## 2013-12-10 LAB — HEPATIC FUNCTION PANEL
ALT: 28 U/L (ref 0–35)
AST: 30 U/L (ref 0–37)
Albumin: 4.6 g/dL (ref 3.5–5.2)
Alkaline Phosphatase: 55 U/L (ref 39–117)
Bilirubin, Direct: 0.1 mg/dL (ref 0.0–0.3)
Indirect Bilirubin: 0.2 mg/dL (ref 0.2–1.2)
Total Bilirubin: 0.3 mg/dL (ref 0.2–1.2)
Total Protein: 6.9 g/dL (ref 6.0–8.3)

## 2013-12-10 LAB — LIPID PANEL
Cholesterol: 201 mg/dL — ABNORMAL HIGH (ref 0–200)
HDL: 90 mg/dL (ref >39)
LDL Cholesterol: 83 mg/dL (ref 0–99)
Total CHOL/HDL Ratio: 2.2 Ratio
Triglycerides: 140 mg/dL (ref <150)
VLDL: 28 mg/dL (ref 0–40)

## 2013-12-10 LAB — TSH: TSH: 6.32 u[IU]/mL — ABNORMAL HIGH (ref 0.350–4.500)

## 2013-12-13 ENCOUNTER — Encounter: Payer: Self-pay | Admitting: Family Medicine

## 2013-12-13 ENCOUNTER — Ambulatory Visit (INDEPENDENT_AMBULATORY_CARE_PROVIDER_SITE_OTHER): Payer: BC Managed Care – PPO | Admitting: Family Medicine

## 2013-12-13 VITALS — BP 140/86 | HR 98 | Temp 98.0°F | Ht 64.25 in | Wt 153.0 lb

## 2013-12-13 DIAGNOSIS — F341 Dysthymic disorder: Secondary | ICD-10-CM

## 2013-12-13 DIAGNOSIS — R030 Elevated blood-pressure reading, without diagnosis of hypertension: Secondary | ICD-10-CM

## 2013-12-13 DIAGNOSIS — F32A Depression, unspecified: Secondary | ICD-10-CM

## 2013-12-13 DIAGNOSIS — E663 Overweight: Secondary | ICD-10-CM

## 2013-12-13 DIAGNOSIS — R946 Abnormal results of thyroid function studies: Secondary | ICD-10-CM

## 2013-12-13 DIAGNOSIS — IMO0001 Reserved for inherently not codable concepts without codable children: Secondary | ICD-10-CM

## 2013-12-13 DIAGNOSIS — F419 Anxiety disorder, unspecified: Secondary | ICD-10-CM

## 2013-12-13 DIAGNOSIS — F329 Major depressive disorder, single episode, unspecified: Secondary | ICD-10-CM

## 2013-12-13 DIAGNOSIS — K219 Gastro-esophageal reflux disease without esophagitis: Secondary | ICD-10-CM

## 2013-12-13 NOTE — Progress Notes (Signed)
Pre visit review using our clinic review tool, if applicable. No additional management support is needed unless otherwise documented below in the visit note. 

## 2013-12-13 NOTE — Patient Instructions (Signed)

## 2013-12-14 LAB — T3, FREE: T3 FREE: 2.7 pg/mL (ref 2.3–4.2)

## 2013-12-14 LAB — T4, FREE: Free T4: 0.87 ng/dL (ref 0.80–1.80)

## 2013-12-14 LAB — THYROID PEROXIDASE ANTIBODY

## 2013-12-19 ENCOUNTER — Encounter: Payer: Self-pay | Admitting: Family Medicine

## 2013-12-19 DIAGNOSIS — E663 Overweight: Secondary | ICD-10-CM | POA: Insufficient documentation

## 2013-12-19 DIAGNOSIS — R946 Abnormal results of thyroid function studies: Secondary | ICD-10-CM | POA: Insufficient documentation

## 2013-12-19 DIAGNOSIS — R030 Elevated blood-pressure reading, without diagnosis of hypertension: Secondary | ICD-10-CM | POA: Insufficient documentation

## 2013-12-19 DIAGNOSIS — IMO0001 Reserved for inherently not codable concepts without codable children: Secondary | ICD-10-CM

## 2013-12-19 HISTORY — DX: Reserved for inherently not codable concepts without codable children: IMO0001

## 2013-12-19 HISTORY — DX: Overweight: E66.3

## 2013-12-19 LAB — HM MAMMOGRAPHY: HM Mammogram: NORMAL

## 2013-12-19 LAB — HM PAP SMEAR: HM Pap smear: NORMAL

## 2013-12-19 NOTE — Assessment & Plan Note (Signed)
Free T4 and thyroid labs normal today.

## 2013-12-19 NOTE — Assessment & Plan Note (Signed)
Avoid offending foods, encouraged probiotics  

## 2013-12-19 NOTE — Progress Notes (Signed)
Patient ID: Darra LisLeslie R Capili, female   DOB: 05/22/1961, 53 y.o.   MRN: [id number] Darra LisLeslie R Beever [id number] 08/06/1961 12/19/2013      Progress Note-Follow Up  Subjective  Chief Complaint  Chief Complaint  Patient presents with  . Follow-up    2 month- weaning off zoloft and doing good    HPI  Patient is a 53 year old Caucasian female who is in today in followup. She's been weaning off of her Zoloft and says she's doing well. She denies any increase in anxiety or depression. She does acknowledge an increase in hot flashes however. No chest pain or palpitations. No shortness of breath GI or GU concerns. No fevers or congestion. Taking other medications as prescribed.  Past Medical History  Diagnosis Date  . Chicken pox   . Depression   . Anxiety   . Arthritis     back  . Diarrhea 01/21/2012  . 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 Diverticulosis 01/22/2012  . Skin lesion of left arm 06/15/2012  . Anxiety and depression 01/21/2012  . Reflux 01/21/2012  . Tinea corporis 09/20/2012  . Elevated BP 12/19/2013  . Overweight 12/19/2013    Past Surgical History  Procedure Laterality Date  . Abdominal hysterectomy  2002    vaginal, partial  . Tonsillectomy  1967  . Titanium rod insertion  1978    right femur  . Titanium rod removal  1980    bone graph, re-insertion  . Titanium rod removal  1981  . Ganglion cyst excision  1987    right wrist  . Bunionectomy  2004    b/l    Family History  Problem Relation Age of Onset  . Hypertension Father   . Hyperlipidemia Father   . Coronary artery disease Father   . Hypertension Mother   . Osteoarthritis Mother   . Osteoporosis Mother   . Scoliosis Mother   . Irritable bowel syndrome Mother   . Cancer Maternal Grandmother     Breast  . Pneumonia Maternal Grandmother   . Cancer Paternal Grandmother   . Cancer Paternal Grandfather     colon  . Colon cancer Paternal Grandfather   . Hypothyroidism Sister   . Obesity Sister     History    Social History  . Marital Status: Widowed    Spouse Name: N/A    Number of Children: N/A  . Years of Education: N/A   Occupational History  . Not on file.   Social History Main Topics  . Smoking status: Never Smoker   . Smokeless tobacco: Never Used  . Alcohol Use: 0.0 oz/week    7-14 drink(s) per week     Comment: 1-2 daily  . Drug Use: No  . Sexual Activity: Yes    Partners: Male   Other Topics Concern  . Not on file   Social History Narrative  . No narrative on file    Current Outpatient Prescriptions on File Prior to Visit  Medication Sig Dispense Refill  . clotrimazole (QC CLOTRIMAZOLE) 1 % cream Apply topically 2 (two) times daily.  45 g  1  . lactobacillus acidophilus & bulgar (LACTINEX) chewable tablet Chew 1 tablet by mouth 3 (three) times daily with meals.  90 tablet  1  . Naproxen (NAPROSYN PO) Take by mouth as needed.        . NON FORMULARY Take 10 mg by mouth as needed. allerclear       . sertraline (ZOLOFT) 50 MG tablet Take 1  tablet (50 mg total) by mouth daily. 1/2 tab po daily for 4-8 weeks and then drop to 1/2 tab every other day and stop at your dicretion  90 tablet  3  . triamcinolone cream (KENALOG) 0.1 %        No current facility-administered medications on file prior to visit.    Allergies  Allergen Reactions  . Amoxicillin   . Sulfonamide Derivatives     Review of Systems  Review of Systems  Constitutional: Negative for fever and malaise/fatigue.  HENT: Negative for congestion.   Eyes: Negative for discharge.  Respiratory: Negative for shortness of breath.   Cardiovascular: Negative for chest pain, palpitations and leg swelling.  Gastrointestinal: Negative for nausea, abdominal pain and diarrhea.  Genitourinary: Negative for dysuria.  Musculoskeletal: Negative for falls.  Skin: Negative for rash.  Neurological: Negative for loss of consciousness and headaches.  Endo/Heme/Allergies: Negative for polydipsia.  Psychiatric/Behavioral:  Negative for depression and suicidal ideas. The patient is not nervous/anxious and does not have insomnia.     Objective  BP 140/86  Pulse 98  Temp(Src) 98 F (36.7 C) (Oral)  Ht 5' 4.25" (1.632 m)  Wt 153 lb (69.4 kg)  BMI 26.06 kg/m2  SpO2 98%  Physical Exam  Physical Exam  Constitutional: She is oriented to person, place, and time and well-developed, well-nourished, and in no distress. No distress.  HENT:  Head: Normocephalic and atraumatic.  Eyes: Conjunctivae are normal.  Neck: Neck supple. No thyromegaly present.  Cardiovascular: Normal rate, regular rhythm and normal heart sounds.   No murmur heard. Pulmonary/Chest: Effort normal and breath sounds normal. She has no wheezes.  Abdominal: She exhibits no distension and no mass.  Musculoskeletal: She exhibits no edema.  Lymphadenopathy:    She has no cervical adenopathy.  Neurological: She is alert and oriented to person, place, and time.  Skin: Skin is warm and dry. No rash noted. She is not diaphoretic.  Psychiatric: Memory, affect and judgment normal.    Lab Results  Component Value Date   TSH 6.320* 12/10/2013   Lab Results  Component Value Date   WBC 5.3 12/10/2013   HGB 13.7 12/10/2013   HCT 40.1 12/10/2013   MCV 92.6 12/10/2013   PLT 247 12/10/2013   Lab Results  Component Value Date   CREATININE 0.58 12/10/2013   BUN 12 12/10/2013   NA 141 12/10/2013   K 4.7 12/10/2013   CL 103 12/10/2013   CO2 29 12/10/2013   Lab Results  Component Value Date   ALT 28 12/10/2013   AST 30 12/10/2013   ALKPHOS 55 12/10/2013   BILITOT 0.3 12/10/2013   Lab Results  Component Value Date   CHOL 201* 12/10/2013   Lab Results  Component Value Date   HDL 90 12/10/2013   Lab Results  Component Value Date   LDLCALC 83 12/10/2013   Lab Results  Component Value Date   TRIG 140 12/10/2013   Lab Results  Component Value Date   CHOLHDL 2.2 12/10/2013     Assessment & Plan  Anxiety and depression Doing well with wean off  Zoloft, will continue weaning off.  Elevated BP Improved on recheck encouraged DASH diet and regular exercise, report any concerning symptoms  Reflux Avoid offending foods, encouraged probiotics  Abnormal results of thyroid function studies Free T4 and thyroid labs normal today.  Overweight Encouraged DASH diet and regular exercise

## 2013-12-19 NOTE — Assessment & Plan Note (Signed)
Encouraged DASH diet and regular exercise 

## 2013-12-19 NOTE — Assessment & Plan Note (Signed)
Doing well with wean off Zoloft, will continue weaning off.

## 2013-12-19 NOTE — Assessment & Plan Note (Signed)
Improved on recheck encouraged DASH diet and regular exercise, report any concerning symptoms

## 2014-02-15 ENCOUNTER — Encounter: Payer: Self-pay | Admitting: Family Medicine

## 2014-02-15 ENCOUNTER — Ambulatory Visit (INDEPENDENT_AMBULATORY_CARE_PROVIDER_SITE_OTHER): Payer: BC Managed Care – PPO | Admitting: Family Medicine

## 2014-02-15 VITALS — BP 120/68 | HR 77 | Temp 99.4°F | Ht 64.25 in | Wt 151.1 lb

## 2014-02-15 DIAGNOSIS — F329 Major depressive disorder, single episode, unspecified: Secondary | ICD-10-CM

## 2014-02-15 DIAGNOSIS — F32A Depression, unspecified: Secondary | ICD-10-CM

## 2014-02-15 DIAGNOSIS — L989 Disorder of the skin and subcutaneous tissue, unspecified: Secondary | ICD-10-CM

## 2014-02-15 DIAGNOSIS — IMO0001 Reserved for inherently not codable concepts without codable children: Secondary | ICD-10-CM

## 2014-02-15 DIAGNOSIS — E663 Overweight: Secondary | ICD-10-CM

## 2014-02-15 DIAGNOSIS — K219 Gastro-esophageal reflux disease without esophagitis: Secondary | ICD-10-CM

## 2014-02-15 DIAGNOSIS — F341 Dysthymic disorder: Secondary | ICD-10-CM

## 2014-02-15 DIAGNOSIS — R946 Abnormal results of thyroid function studies: Secondary | ICD-10-CM

## 2014-02-15 DIAGNOSIS — F419 Anxiety disorder, unspecified: Secondary | ICD-10-CM

## 2014-02-15 MED ORDER — LACTINEX PO CHEW: 1.0000 | CHEWABLE_TABLET | Freq: Three times a day (TID) | ORAL | Status: DC

## 2014-02-15 NOTE — Progress Notes (Signed)
Pre visit review using our clinic review tool, if applicable. No additional management support is needed unless otherwise documented below in the visit note. 

## 2014-02-16 LAB — TSH: TSH: 7.777 u[IU]/mL — AB (ref 0.350–4.500)

## 2014-02-16 LAB — T4, FREE: FREE T4: 0.72 ng/dL — AB (ref 0.80–1.80)

## 2014-02-16 LAB — T3, FREE: T3 FREE: 2.8 pg/mL (ref 2.3–4.2)

## 2014-02-20 ENCOUNTER — Encounter: Payer: Self-pay | Admitting: Family Medicine

## 2014-02-20 NOTE — Assessment & Plan Note (Signed)
Encouraged DASH diet, decrease po intake and increase exercise as tolerated. Needs 7-8 hours of sleep nightly. Avoid trans fats, eat small, frequent meals every 4-5 hours with lean proteins, complex carbs and healthy fats. Minimize simple carbs, GMO foods. 

## 2014-02-20 NOTE — Assessment & Plan Note (Signed)
Avoid offending foods, start probiotics. Do not eat large meals in late evening and consider raising head of bed.  

## 2014-02-20 NOTE — Progress Notes (Signed)
Patient ID: Kristen Cooke Birchall, female   DOB: 08/04/1961, 53 y.o.   MRN: [id number] Kristen Cooke Duhe [id number] 05/13/1961 02/20/2014      Progress Note-Follow Up  Subjective  Chief Complaint  Chief Complaint  Patient presents with  . Follow-up    9 week    HPI  Patient is a 53 year old female in today for routine medical care. Well controlled, no changes to meds. Encouraged heart healthy diet such as the DASH diet and exercise as tolerated. Doing well. Has been struggling with increased stress due to her mother's illness but has felt she has managed it well. No worsening depression or anxiety. No recent illness  Past Medical History  Diagnosis Date  . Chicken pox   . Depression   . Anxiety   . Arthritis     back  . Diarrhea 01/21/2012  . 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 Diverticulosis 01/22/2012  . Skin lesion of left arm 06/15/2012  . Anxiety and depression 01/21/2012  . Reflux 01/21/2012  . Tinea corporis 09/20/2012  . Elevated BP 12/19/2013  . Overweight 12/19/2013    Past Surgical History  Procedure Laterality Date  . Abdominal hysterectomy  2002    vaginal, partial  . Tonsillectomy  1967  . Titanium rod insertion  1978    right femur  . Titanium rod removal  1980    bone graph, re-insertion  . Titanium rod removal  1981  . Ganglion cyst excision  1987    right wrist  . Bunionectomy  2004    b/l    Family History  Problem Relation Age of Onset  . Hypertension Father   . Hyperlipidemia Father   . Coronary artery disease Father   . Hypertension Mother   . Osteoarthritis Mother   . Osteoporosis Mother   . Scoliosis Mother   . Irritable bowel syndrome Mother   . Cancer Maternal Grandmother     Breast  . Pneumonia Maternal Grandmother   . Cancer Paternal Grandmother   . Cancer Paternal Grandfather     colon  . Colon cancer Paternal Grandfather   . Hypothyroidism Sister   . Obesity Sister     History   Social History  . Marital Status: Widowed    Spouse Name:  N/A    Number of Children: N/A  . Years of Education: N/A   Occupational History  . Not on file.   Social History Main Topics  . Smoking status: Never Smoker   . Smokeless tobacco: Never Used  . Alcohol Use: 0.0 oz/week    7-14 drink(s) per week     Comment: 1-2 daily  . Drug Use: No  . Sexual Activity: Yes    Partners: Male   Other Topics Concern  . Not on file   Social History Narrative  . No narrative on file    Current Outpatient Prescriptions on File Prior to Visit  Medication Sig Dispense Refill  . clotrimazole (QC CLOTRIMAZOLE) 1 % cream Apply topically 2 (two) times daily.  45 g  1  . Naproxen (NAPROSYN PO) Take by mouth as needed.        . NON FORMULARY Take 10 mg by mouth as needed. allerclear       . triamcinolone cream (KENALOG) 0.1 %        No current facility-administered medications on file prior to visit.    Allergies  Allergen Reactions  . Amoxicillin   . Sulfonamide Derivatives  Review of Systems  Review of Systems  Constitutional: Negative for fever and malaise/fatigue.  HENT: Negative for congestion.   Eyes: Negative for discharge.  Respiratory: Negative for shortness of breath.   Cardiovascular: Negative for chest pain, palpitations and leg swelling.  Gastrointestinal: Negative for nausea, abdominal pain and diarrhea.  Genitourinary: Negative for dysuria.  Musculoskeletal: Negative for falls.  Skin: Negative for rash.  Neurological: Negative for loss of consciousness and headaches.  Endo/Heme/Allergies: Negative for polydipsia.  Psychiatric/Behavioral: Negative for depression and suicidal ideas. The patient is not nervous/anxious and does not have insomnia.     Objective  BP 120/68  Pulse 77  Temp(Src) 99.4 F (37.4 C) (Oral)  Ht 5' 4.25" (1.632 m)  Wt 151 lb 1.3 oz (68.529 kg)  BMI 25.73 kg/m2  SpO2 97%  Physical Exam  Physical Exam  Constitutional: She is oriented to person, place, and time and well-developed,  well-nourished, and in no distress. No distress.  HENT:  Head: Normocephalic and atraumatic.  Eyes: Conjunctivae are normal.  Neck: Neck supple. No thyromegaly present.  Cardiovascular: Normal rate, regular rhythm and normal heart sounds.   No murmur heard. Pulmonary/Chest: Effort normal and breath sounds normal. She has no wheezes.  Abdominal: She exhibits no distension and no mass.  Musculoskeletal: She exhibits no edema.  Lymphadenopathy:    She has no cervical adenopathy.  Neurological: She is alert and oriented to person, place, and time.  Skin: Skin is warm and dry. No rash noted. She is not diaphoretic.  Psychiatric: Memory, affect and judgment normal.    Lab Results  Component Value Date   TSH 7.777* 02/15/2014   Lab Results  Component Value Date   WBC 5.3 12/10/2013   HGB 13.7 12/10/2013   HCT 40.1 12/10/2013   MCV 92.6 12/10/2013   PLT 247 12/10/2013   Lab Results  Component Value Date   CREATININE 0.58 12/10/2013   BUN 12 12/10/2013   NA 141 12/10/2013   K 4.7 12/10/2013   CL 103 12/10/2013   CO2 29 12/10/2013   Lab Results  Component Value Date   ALT 28 12/10/2013   AST 30 12/10/2013   ALKPHOS 55 12/10/2013   BILITOT 0.3 12/10/2013   Lab Results  Component Value Date   CHOL 201* 12/10/2013   Lab Results  Component Value Date   HDL 90 12/10/2013   Lab Results  Component Value Date   LDLCALC 83 12/10/2013   Lab Results  Component Value Date   TRIG 140 12/10/2013   Lab Results  Component Value Date   CHOLHDL 2.2 12/10/2013     Assessment & Plan  Anxiety and depression Continues to do well without her SSRI despite having to manage her mother's recent worsening health  Skin lesion of left arm Saw dermatology recently, no new concerns  Reflux Avoid offending foods, start probiotics. Do not eat large meals in late evening and consider raising head of bed.   Overweight Encouraged DASH diet, decrease po intake and increase exercise as tolerated. Needs 7-8  hours of sleep nightly. Avoid trans fats, eat small, frequent meals every 4-5 hours with lean proteins, complex carbs and healthy fats. Minimize simple carbs, GMO foods.

## 2014-02-20 NOTE — Assessment & Plan Note (Signed)
tsh elevated free T4 normal. Will continue to monitor

## 2014-02-20 NOTE — Assessment & Plan Note (Signed)
Saw dermatology recently, no new concerns

## 2014-02-20 NOTE — Assessment & Plan Note (Signed)
Continues to do well without her SSRI despite having to manage her mother's recent worsening health

## 2014-02-21 ENCOUNTER — Telehealth: Payer: Self-pay | Admitting: *Deleted

## 2014-02-21 DIAGNOSIS — E039 Hypothyroidism, unspecified: Secondary | ICD-10-CM

## 2014-02-21 NOTE — Telephone Encounter (Signed)
Notified pt. She states she is in the car and can't write anything down at the moment and states she will call us back tomorrow to discuss further. Future lab order entered.

## 2014-02-21 NOTE — Telephone Encounter (Signed)
Message copied by Kathi SimpersFERGERSON, Nalea Salce A on Mon Feb 21, 2014  4:59 PM ------      Message from: Danise EdgeBLYTH, STACEY A      Created: Wed Feb 16, 2014  7:31 AM       Now is officially hypothyroid, needs to start Levothyroxine 25 mcg po qd disp #30 with 3 rf and recheck TSH and free T4 in 3 months ------

## 2014-02-22 ENCOUNTER — Other Ambulatory Visit: Payer: Self-pay

## 2014-02-22 MED ORDER — LEVOTHYROXINE SODIUM 25 MCG PO TABS: 25.0000 ug | ORAL_TABLET | Freq: Every day | ORAL | Status: DC

## 2014-05-25 ENCOUNTER — Telehealth: Payer: Self-pay | Admitting: Family

## 2014-05-25 ENCOUNTER — Encounter: Payer: Self-pay | Admitting: Family

## 2014-05-25 DIAGNOSIS — E039 Hypothyroidism, unspecified: Secondary | ICD-10-CM

## 2014-05-25 HISTORY — DX: Hypothyroidism, unspecified: E03.9

## 2014-05-25 LAB — TSH: TSH: 5.284 u[IU]/mL — AB (ref 0.350–4.500)

## 2014-05-25 LAB — T4, FREE: Free T4: 0.83 ng/dL (ref 0.80–1.80)

## 2014-05-25 MED ORDER — LEVOTHYROXINE SODIUM 50 MCG PO TABS: 50.0000 ug | ORAL_TABLET | Freq: Every day | ORAL | Status: DC

## 2014-05-25 NOTE — Telephone Encounter (Signed)
Labs show we need to increase synthroid from 25 mcg to . Follow up tsh in 6 weeks.

## 2014-05-27 ENCOUNTER — Telehealth: Payer: Self-pay

## 2014-05-27 NOTE — Telephone Encounter (Signed)
Another note opened on this

## 2014-05-27 NOTE — Telephone Encounter (Signed)
Pt called requesting her lab results be sent to her mychart

## 2014-05-30 NOTE — Telephone Encounter (Signed)
I agree with Levothyroxine change

## 2014-06-23 ENCOUNTER — Encounter: Payer: BC Managed Care – PPO | Admitting: Family Medicine

## 2014-08-08 ENCOUNTER — Other Ambulatory Visit: Payer: BC Managed Care – PPO

## 2014-08-15 ENCOUNTER — Ambulatory Visit (INDEPENDENT_AMBULATORY_CARE_PROVIDER_SITE_OTHER): Payer: BC Managed Care – PPO | Admitting: Family Medicine

## 2014-08-15 ENCOUNTER — Encounter: Payer: Self-pay | Admitting: Family Medicine

## 2014-08-15 VITALS — BP 125/72 | HR 72 | Temp 98.3°F | Ht 64.25 in | Wt 140.8 lb

## 2014-08-15 DIAGNOSIS — IMO0001 Reserved for inherently not codable concepts without codable children: Secondary | ICD-10-CM

## 2014-08-15 DIAGNOSIS — F419 Anxiety disorder, unspecified: Secondary | ICD-10-CM

## 2014-08-15 DIAGNOSIS — R946 Abnormal results of thyroid function studies: Secondary | ICD-10-CM

## 2014-08-15 DIAGNOSIS — F329 Major depressive disorder, single episode, unspecified: Secondary | ICD-10-CM

## 2014-08-15 DIAGNOSIS — R030 Elevated blood-pressure reading, without diagnosis of hypertension: Secondary | ICD-10-CM

## 2014-08-15 DIAGNOSIS — Z0000 Encounter for general adult medical examination without abnormal findings: Secondary | ICD-10-CM

## 2014-08-15 DIAGNOSIS — F418 Other specified anxiety disorders: Secondary | ICD-10-CM

## 2014-08-15 DIAGNOSIS — F32A Depression, unspecified: Secondary | ICD-10-CM

## 2014-08-15 DIAGNOSIS — E039 Hypothyroidism, unspecified: Secondary | ICD-10-CM

## 2014-08-15 LAB — HEPATIC FUNCTION PANEL
ALBUMIN: 4 g/dL (ref 3.5–5.2)
ALK PHOS: 50 U/L (ref 39–117)
ALT: 47 U/L — ABNORMAL HIGH (ref 0–35)
AST: 39 U/L — ABNORMAL HIGH (ref 0–37)
BILIRUBIN DIRECT: 0 mg/dL (ref 0.0–0.3)
Total Bilirubin: 0.7 mg/dL (ref 0.2–1.2)
Total Protein: 7.8 g/dL (ref 6.0–8.3)

## 2014-08-15 LAB — RENAL FUNCTION PANEL
ALBUMIN: 4 g/dL (ref 3.5–5.2)
BUN: 20 mg/dL (ref 6–23)
CHLORIDE: 101 meq/L (ref 96–112)
CO2: 30 mEq/L (ref 19–32)
Calcium: 9.6 mg/dL (ref 8.4–10.5)
Creatinine, Ser: 0.6 mg/dL (ref 0.4–1.2)
GFR: 110.46 mL/min (ref >60.00)
GLUCOSE: 95 mg/dL (ref 70–99)
POTASSIUM: 4 meq/L (ref 3.5–5.1)
Phosphorus: 3.3 mg/dL (ref 2.3–4.6)
Sodium: 137 mEq/L (ref 135–145)

## 2014-08-15 LAB — CBC
HCT: 43.4 % (ref 36.0–46.0)
HEMOGLOBIN: 14.4 g/dL (ref 12.0–15.0)
MCHC: 33.2 g/dL (ref 30.0–36.0)
MCV: 95.3 fl (ref 78.0–100.0)
Platelets: 211 10*3/uL (ref 150.0–400.0)
RBC: 4.56 Mil/uL (ref 3.87–5.11)
RDW: 12.9 % (ref 11.5–15.5)
WBC: 5.9 10*3/uL (ref 4.0–10.5)

## 2014-08-15 LAB — LIPID PANEL
CHOLESTEROL: 216 mg/dL — AB (ref 0–200)
HDL: 87 mg/dL (ref >39.00)
LDL Cholesterol: 115 mg/dL — ABNORMAL HIGH (ref 0–99)
NonHDL: 129
Total CHOL/HDL Ratio: 2
Triglycerides: 72 mg/dL (ref 0.0–149.0)
VLDL: 14.4 mg/dL (ref 0.0–40.0)

## 2014-08-15 LAB — TSH: TSH: 1.88 u[IU]/mL (ref 0.35–4.50)

## 2014-08-15 NOTE — Patient Instructions (Signed)
Preventive Care for Adults A healthy lifestyle and preventive care can promote health and wellness. Preventive health guidelines for women include the following key practices.  A routine yearly physical is a good way to check with your health care provider about your health and preventive screening. It is a chance to share any concerns and updates on your health and to receive a thorough exam.  Visit your dentist for a routine exam and preventive care every 6 months. Brush your teeth twice a day and floss once a day. Good oral hygiene prevents tooth decay and gum disease.  The frequency of eye exams is based on your age, health, family medical history, use of contact lenses, and other factors. Follow your health care provider's recommendations for frequency of eye exams.  Eat a healthy diet. Foods like vegetables, fruits, whole grains, low-fat dairy products, and lean protein foods contain the nutrients you need without too many calories. Decrease your intake of foods high in solid fats, added sugars, and salt. Eat the right amount of calories for you.Get information about a proper diet from your health care provider, if necessary.  Regular physical exercise is one of the most important things you can do for your health. Most adults should get at least 150 minutes of moderate-intensity exercise (any activity that increases your heart rate and causes you to sweat) each week. In addition, most adults need muscle-strengthening exercises on 2 or more days a week.  Maintain a healthy weight. The body mass index (BMI) is a screening tool to identify possible weight problems. It provides an estimate of body fat based on height and weight. Your health care provider can find your BMI and can help you achieve or maintain a healthy weight.For adults 20 years and older:  A BMI below 18.5 is considered underweight.  A BMI of 18.5 to 24.9 is normal.  A BMI of 25 to 29.9 is considered overweight.  A BMI of  30 and above is considered obese.  Maintain normal blood lipids and cholesterol levels by exercising and minimizing your intake of saturated fat. Eat a balanced diet with plenty of fruit and vegetables. Blood tests for lipids and cholesterol should begin at age 76 and be repeated every 5 years. If your lipid or cholesterol levels are high, you are over 50, or you are at high risk for heart disease, you may need your cholesterol levels checked more frequently.Ongoing high lipid and cholesterol levels should be treated with medicines if diet and exercise are not working.  If you smoke, find out from your health care provider how to quit. If you do not use tobacco, do not start.  Lung cancer screening is recommended for adults aged 22-80 years who are at high risk for developing lung cancer because of a history of smoking. A yearly low-dose CT scan of the lungs is recommended for people who have at least a 30-pack-year history of smoking and are a current smoker or have quit within the past 15 years. A pack year of smoking is smoking an average of 1 pack of cigarettes a day for 1 year (for example: 1 pack a day for 30 years or 2 packs a day for 15 years). Yearly screening should continue until the smoker has stopped smoking for at least 15 years. Yearly screening should be stopped for people who develop a health problem that would prevent them from having lung cancer treatment.  If you are pregnant, do not drink alcohol. If you are breastfeeding,  be very cautious about drinking alcohol. If you are not pregnant and choose to drink alcohol, do not have more than 1 drink per day. One drink is considered to be 12 ounces (355 mL) of beer, 5 ounces (148 mL) of wine, or 1.5 ounces (44 mL) of liquor.  Avoid use of street drugs. Do not share needles with anyone. Ask for help if you need support or instructions about stopping the use of drugs.  High blood pressure causes heart disease and increases the risk of  stroke. Your blood pressure should be checked at least every 1 to 2 years. Ongoing high blood pressure should be treated with medicines if weight loss and exercise do not work.  If you are 75-52 years old, ask your health care provider if you should take aspirin to prevent strokes.  Diabetes screening involves taking a blood sample to check your fasting blood sugar level. This should be done once every 3 years, after age 15, if you are within normal weight and without risk factors for diabetes. Testing should be considered at a younger age or be carried out more frequently if you are overweight and have at least 1 risk factor for diabetes.  Breast cancer screening is essential preventive care for women. You should practice "breast self-awareness." This means understanding the normal appearance and feel of your breasts and may include breast self-examination. Any changes detected, no matter how small, should be reported to a health care provider. Women in their 58s and 30s should have a clinical breast exam (CBE) by a health care provider as part of a regular health exam every 1 to 3 years. After age 16, women should have a CBE every year. Starting at age 53, women should consider having a mammogram (breast X-ray test) every year. Women who have a family history of breast cancer should talk to their health care provider about genetic screening. Women at a high risk of breast cancer should talk to their health care providers about having an MRI and a mammogram every year.  Breast cancer gene (BRCA)-related cancer risk assessment is recommended for women who have family members with BRCA-related cancers. BRCA-related cancers include breast, ovarian, tubal, and peritoneal cancers. Having family members with these cancers may be associated with an increased risk for harmful changes (mutations) in the breast cancer genes BRCA1 and BRCA2. Results of the assessment will determine the need for genetic counseling and  BRCA1 and BRCA2 testing.  Routine pelvic exams to screen for cancer are no longer recommended for nonpregnant women who are considered low risk for cancer of the pelvic organs (ovaries, uterus, and vagina) and who do not have symptoms. Ask your health care provider if a screening pelvic exam is right for you.  If you have had past treatment for cervical cancer or a condition that could lead to cancer, you need Pap tests and screening for cancer for at least 20 years after your treatment. If Pap tests have been discontinued, your risk factors (such as having a new sexual partner) need to be reassessed to determine if screening should be resumed. Some women have medical problems that increase the chance of getting cervical cancer. In these cases, your health care provider may recommend more frequent screening and Pap tests.  The HPV test is an additional test that may be used for cervical cancer screening. The HPV test looks for the virus that can cause the cell changes on the cervix. The cells collected during the Pap test can be  tested for HPV. The HPV test could be used to screen women aged 30 years and older, and should be used in women of any age who have unclear Pap test results. After the age of 30, women should have HPV testing at the same frequency as a Pap test.  Colorectal cancer can be detected and often prevented. Most routine colorectal cancer screening begins at the age of 50 years and continues through age 75 years. However, your health care provider may recommend screening at an earlier age if you have risk factors for colon cancer. On a yearly basis, your health care provider may provide home test kits to check for hidden blood in the stool. Use of a small camera at the end of a tube, to directly examine the colon (sigmoidoscopy or colonoscopy), can detect the earliest forms of colorectal cancer. Talk to your health care provider about this at age 50, when routine screening begins. Direct  exam of the colon should be repeated every 5-10 years through age 75 years, unless early forms of pre-cancerous polyps or small growths are found.  People who are at an increased risk for hepatitis B should be screened for this virus. You are considered at high risk for hepatitis B if:  You were born in a country where hepatitis B occurs often. Talk with your health care provider about which countries are considered high risk.  Your parents were born in a high-risk country and you have not received a shot to protect against hepatitis B (hepatitis B vaccine).  You have HIV or AIDS.  You use needles to inject street drugs.  You live with, or have sex with, someone who has hepatitis B.  You get hemodialysis treatment.  You take certain medicines for conditions like cancer, organ transplantation, and autoimmune conditions.  Hepatitis C blood testing is recommended for all people born from 1945 through 1965 and any individual with known risks for hepatitis C.  Practice safe sex. Use condoms and avoid high-risk sexual practices to reduce the spread of sexually transmitted infections (STIs). STIs include gonorrhea, chlamydia, syphilis, trichomonas, herpes, HPV, and human immunodeficiency virus (HIV). Herpes, HIV, and HPV are viral illnesses that have no cure. They can result in disability, cancer, and death.  You should be screened for sexually transmitted illnesses (STIs) including gonorrhea and chlamydia if:  You are sexually active and are younger than 24 years.  You are older than 24 years and your health care provider tells you that you are at risk for this type of infection.  Your sexual activity has changed since you were last screened and you are at an increased risk for chlamydia or gonorrhea. Ask your health care provider if you are at risk.  If you are at risk of being infected with HIV, it is recommended that you take a prescription medicine daily to prevent HIV infection. This is  called preexposure prophylaxis (PrEP). You are considered at risk if:  You are a heterosexual woman, are sexually active, and are at increased risk for HIV infection.  You take drugs by injection.  You are sexually active with a partner who has HIV.  Talk with your health care provider about whether you are at high risk of being infected with HIV. If you choose to begin PrEP, you should first be tested for HIV. You should then be tested every 3 months for as long as you are taking PrEP.  Osteoporosis is a disease in which the bones lose minerals and strength   with aging. This can result in serious bone fractures or breaks. The risk of osteoporosis can be identified using a bone density scan. Women ages 65 years and over and women at risk for fractures or osteoporosis should discuss screening with their health care providers. Ask your health care provider whether you should take a calcium supplement or vitamin D to reduce the rate of osteoporosis.  Menopause can be associated with physical symptoms and risks. Hormone replacement therapy is available to decrease symptoms and risks. You should talk to your health care provider about whether hormone replacement therapy is right for you.  Use sunscreen. Apply sunscreen liberally and repeatedly throughout the day. You should seek shade when your shadow is shorter than you. Protect yourself by wearing long sleeves, pants, a wide-brimmed hat, and sunglasses year round, whenever you are outdoors.  Once a month, do a whole body skin exam, using a mirror to look at the skin on your back. Tell your health care provider of new moles, moles that have irregular borders, moles that are larger than a pencil eraser, or moles that have changed in shape or color.  Stay current with required vaccines (immunizations).  Influenza vaccine. All adults should be immunized every year.  Tetanus, diphtheria, and acellular pertussis (Td, Tdap) vaccine. Pregnant women should  receive 1 dose of Tdap vaccine during each pregnancy. The dose should be obtained regardless of the length of time since the last dose. Immunization is preferred during the 27th-36th week of gestation. An adult who has not previously received Tdap or who does not know her vaccine status should receive 1 dose of Tdap. This initial dose should be followed by tetanus and diphtheria toxoids (Td) booster doses every 10 years. Adults with an unknown or incomplete history of completing a 3-dose immunization series with Td-containing vaccines should begin or complete a primary immunization series including a Tdap dose. Adults should receive a Td booster every 10 years.  Varicella vaccine. An adult without evidence of immunity to varicella should receive 2 doses or a second dose if she has previously received 1 dose. Pregnant females who do not have evidence of immunity should receive the first dose after pregnancy. This first dose should be obtained before leaving the health care facility. The second dose should be obtained 4-8 weeks after the first dose.  Human papillomavirus (HPV) vaccine. Females aged 13-26 years who have not received the vaccine previously should obtain the 3-dose series. The vaccine is not recommended for use in pregnant females. However, pregnancy testing is not needed before receiving a dose. If a female is found to be pregnant after receiving a dose, no treatment is needed. In that case, the remaining doses should be delayed until after the pregnancy. Immunization is recommended for any person with an immunocompromised condition through the age of 26 years if she did not get any or all doses earlier. During the 3-dose series, the second dose should be obtained 4-8 weeks after the first dose. The third dose should be obtained 24 weeks after the first dose and 16 weeks after the second dose.  Zoster vaccine. One dose is recommended for adults aged 60 years or older unless certain conditions are  present.  Measles, mumps, and rubella (MMR) vaccine. Adults born before 1957 generally are considered immune to measles and mumps. Adults born in 1957 or later should have 1 or more doses of MMR vaccine unless there is a contraindication to the vaccine or there is laboratory evidence of immunity to   each of the three diseases. A routine second dose of MMR vaccine should be obtained at least 28 days after the first dose for students attending postsecondary schools, health care workers, or international travelers. People who received inactivated measles vaccine or an unknown type of measles vaccine during 1963-1967 should receive 2 doses of MMR vaccine. People who received inactivated mumps vaccine or an unknown type of mumps vaccine before 1979 and are at high risk for mumps infection should consider immunization with 2 doses of MMR vaccine. For females of childbearing age, rubella immunity should be determined. If there is no evidence of immunity, females who are not pregnant should be vaccinated. If there is no evidence of immunity, females who are pregnant should delay immunization until after pregnancy. Unvaccinated health care workers born before 1957 who lack laboratory evidence of measles, mumps, or rubella immunity or laboratory confirmation of disease should consider measles and mumps immunization with 2 doses of MMR vaccine or rubella immunization with 1 dose of MMR vaccine.  Pneumococcal 13-valent conjugate (PCV13) vaccine. When indicated, a person who is uncertain of her immunization history and has no record of immunization should receive the PCV13 vaccine. An adult aged 19 years or older who has certain medical conditions and has not been previously immunized should receive 1 dose of PCV13 vaccine. This PCV13 should be followed with a dose of pneumococcal polysaccharide (PPSV23) vaccine. The PPSV23 vaccine dose should be obtained at least 8 weeks after the dose of PCV13 vaccine. An adult aged 19  years or older who has certain medical conditions and previously received 1 or more doses of PPSV23 vaccine should receive 1 dose of PCV13. The PCV13 vaccine dose should be obtained 1 or more years after the last PPSV23 vaccine dose.  Pneumococcal polysaccharide (PPSV23) vaccine. When PCV13 is also indicated, PCV13 should be obtained first. All adults aged 65 years and older should be immunized. An adult younger than age 65 years who has certain medical conditions should be immunized. Any person who resides in a nursing home or long-term care facility should be immunized. An adult smoker should be immunized. People with an immunocompromised condition and certain other conditions should receive both PCV13 and PPSV23 vaccines. People with human immunodeficiency virus (HIV) infection should be immunized as soon as possible after diagnosis. Immunization during chemotherapy or radiation therapy should be avoided. Routine use of PPSV23 vaccine is not recommended for American Indians, [place] Natives, or people younger than 65 years unless there are medical conditions that require PPSV23 vaccine. When indicated, people who have unknown immunization and have no record of immunization should receive PPSV23 vaccine. One-time revaccination 5 years after the first dose of PPSV23 is recommended for people aged 19-64 years who have chronic kidney failure, nephrotic syndrome, asplenia, or immunocompromised conditions. People who received 1-2 doses of PPSV23 before age 65 years should receive another dose of PPSV23 vaccine at age 65 years or later if at least 5 years have passed since the previous dose. Doses of PPSV23 are not needed for people immunized with PPSV23 at or after age 65 years.  Meningococcal vaccine. Adults with asplenia or persistent complement component deficiencies should receive 2 doses of quadrivalent meningococcal conjugate (MenACWY-D) vaccine. The doses should be obtained at least 2 months apart.  Microbiologists working with certain meningococcal bacteria, military recruits, people at risk during an outbreak, and people who travel to or live in countries with a high rate of meningitis should be immunized. A first-year college student up through age   21 years who is living in a residence hall should receive a dose if she did not receive a dose on or after her 16th birthday. Adults who have certain high-risk conditions should receive one or more doses of vaccine.  Hepatitis A vaccine. Adults who wish to be protected from this disease, have certain high-risk conditions, work with hepatitis A-infected animals, work in hepatitis A research labs, or travel to or work in countries with a high rate of hepatitis A should be immunized. Adults who were previously unvaccinated and who anticipate close contact with an international adoptee during the first 60 days after arrival in the [place] from a country with a high rate of hepatitis A should be immunized.  Hepatitis B vaccine. Adults who wish to be protected from this disease, have certain high-risk conditions, may be exposed to blood or other infectious body fluids, are household contacts or sex partners of hepatitis B positive people, are clients or workers in certain care facilities, or travel to or work in countries with a high rate of hepatitis B should be immunized.  Haemophilus influenzae type b (Hib) vaccine. A previously unvaccinated person with asplenia or sickle cell disease or having a scheduled splenectomy should receive 1 dose of Hib vaccine. Regardless of previous immunization, a recipient of a hematopoietic stem cell transplant should receive a 3-dose series 6-12 months after her successful transplant. Hib vaccine is not recommended for adults with HIV infection. Preventive Services / Frequency Ages 64 to 68 years  Blood pressure check.** / Every 1 to 2 years.  Lipid and cholesterol check.** / Every 5 years beginning at age  22.  Clinical breast exam.** / Every 3 years for women in their 88s and 53s.  BRCA-related cancer risk assessment.** / For women who have family members with a BRCA-related cancer (breast, ovarian, tubal, or peritoneal cancers).  Pap test.** / Every 2 years from ages 90 through 51. Every 3 years starting at age 21 through age 56 or 3 with a history of 3 consecutive normal Pap tests.  HPV screening.** / Every 3 years from ages 24 through ages 1 to 46 with a history of 3 consecutive normal Pap tests.  Hepatitis C blood test.** / For any individual with known risks for hepatitis C.  Skin self-exam. / Monthly.  Influenza vaccine. / Every year.  Tetanus, diphtheria, and acellular pertussis (Tdap, Td) vaccine.** / Consult your health care provider. Pregnant women should receive 1 dose of Tdap vaccine during each pregnancy. 1 dose of Td every 10 years.  Varicella vaccine.** / Consult your health care provider. Pregnant females who do not have evidence of immunity should receive the first dose after pregnancy.  HPV vaccine. / 3 doses over 6 months, if 72 and younger. The vaccine is not recommended for use in pregnant females. However, pregnancy testing is not needed before receiving a dose.  Measles, mumps, rubella (MMR) vaccine.** / You need at least 1 dose of MMR if you were born in 1957 or later. You may also need a 2nd dose. For females of childbearing age, rubella immunity should be determined. If there is no evidence of immunity, females who are not pregnant should be vaccinated. If there is no evidence of immunity, females who are pregnant should delay immunization until after pregnancy.  Pneumococcal 13-valent conjugate (PCV13) vaccine.** / Consult your health care provider.  Pneumococcal polysaccharide (PPSV23) vaccine.** / 1 to 2 doses if you smoke cigarettes or if you have certain conditions.  Meningococcal vaccine.** /  1 dose if you are age 19 to 21 years and a first-year college  student living in a residence hall, or have one of several medical conditions, you need to get vaccinated against meningococcal disease. You may also need additional booster doses.  Hepatitis A vaccine.** / Consult your health care provider.  Hepatitis B vaccine.** / Consult your health care provider.  Haemophilus influenzae type b (Hib) vaccine.** / Consult your health care provider. Ages 40 to 64 years  Blood pressure check.** / Every 1 to 2 years.  Lipid and cholesterol check.** / Every 5 years beginning at age 20 years.  Lung cancer screening. / Every year if you are aged 55-80 years and have a 30-pack-year history of smoking and currently smoke or have quit within the past 15 years. Yearly screening is stopped once you have quit smoking for at least 15 years or develop a health problem that would prevent you from having lung cancer treatment.  Clinical breast exam.** / Every year after age 40 years.  BRCA-related cancer risk assessment.** / For women who have family members with a BRCA-related cancer (breast, ovarian, tubal, or peritoneal cancers).  Mammogram.** / Every year beginning at age 40 years and continuing for as long as you are in good health. Consult with your health care provider.  Pap test.** / Every 3 years starting at age 30 years through age 65 or 70 years with a history of 3 consecutive normal Pap tests.  HPV screening.** / Every 3 years from ages 30 years through ages 65 to 70 years with a history of 3 consecutive normal Pap tests.  Fecal occult blood test (FOBT) of stool. / Every year beginning at age 50 years and continuing until age 75 years. You may not need to do this test if you get a colonoscopy every 10 years.  Flexible sigmoidoscopy or colonoscopy.** / Every 5 years for a flexible sigmoidoscopy or every 10 years for a colonoscopy beginning at age 50 years and continuing until age 75 years.  Hepatitis C blood test.** / For all people born from 1945 through  1965 and any individual with known risks for hepatitis C.  Skin self-exam. / Monthly.  Influenza vaccine. / Every year.  Tetanus, diphtheria, and acellular pertussis (Tdap/Td) vaccine.** / Consult your health care provider. Pregnant women should receive 1 dose of Tdap vaccine during each pregnancy. 1 dose of Td every 10 years.  Varicella vaccine.** / Consult your health care provider. Pregnant females who do not have evidence of immunity should receive the first dose after pregnancy.  Zoster vaccine.** / 1 dose for adults aged 60 years or older.  Measles, mumps, rubella (MMR) vaccine.** / You need at least 1 dose of MMR if you were born in 1957 or later. You may also need a 2nd dose. For females of childbearing age, rubella immunity should be determined. If there is no evidence of immunity, females who are not pregnant should be vaccinated. If there is no evidence of immunity, females who are pregnant should delay immunization until after pregnancy.  Pneumococcal 13-valent conjugate (PCV13) vaccine.** / Consult your health care provider.  Pneumococcal polysaccharide (PPSV23) vaccine.** / 1 to 2 doses if you smoke cigarettes or if you have certain conditions.  Meningococcal vaccine.** / Consult your health care provider.  Hepatitis A vaccine.** / Consult your health care provider.  Hepatitis B vaccine.** / Consult your health care provider.  Haemophilus influenzae type b (Hib) vaccine.** / Consult your health care provider. Ages 65   years and over  Blood pressure check.** / Every 1 to 2 years.  Lipid and cholesterol check.** / Every 5 years beginning at age 22 years.  Lung cancer screening. / Every year if you are aged 73-80 years and have a 30-pack-year history of smoking and currently smoke or have quit within the past 15 years. Yearly screening is stopped once you have quit smoking for at least 15 years or develop a health problem that would prevent you from having lung cancer  treatment.  Clinical breast exam.** / Every year after age 4 years.  BRCA-related cancer risk assessment.** / For women who have family members with a BRCA-related cancer (breast, ovarian, tubal, or peritoneal cancers).  Mammogram.** / Every year beginning at age 40 years and continuing for as long as you are in good health. Consult with your health care provider.  Pap test.** / Every 3 years starting at age 9 years through age 34 or 91 years with 3 consecutive normal Pap tests. Testing can be stopped between 65 and 70 years with 3 consecutive normal Pap tests and no abnormal Pap or HPV tests in the past 10 years.  HPV screening.** / Every 3 years from ages 57 years through ages 64 or 45 years with a history of 3 consecutive normal Pap tests. Testing can be stopped between 65 and 70 years with 3 consecutive normal Pap tests and no abnormal Pap or HPV tests in the past 10 years.  Fecal occult blood test (FOBT) of stool. / Every year beginning at age 15 years and continuing until age 17 years. You may not need to do this test if you get a colonoscopy every 10 years.  Flexible sigmoidoscopy or colonoscopy.** / Every 5 years for a flexible sigmoidoscopy or every 10 years for a colonoscopy beginning at age 86 years and continuing until age 71 years.  Hepatitis C blood test.** / For all people born from [birth year] through 1966 and any individual with known risks for hepatitis C.  Osteoporosis screening.** / A one-time screening for women ages 83 years and over and women at risk for fractures or osteoporosis.  Skin self-exam. / Monthly.  Influenza vaccine. / Every year.  Tetanus, diphtheria, and acellular pertussis (Tdap/Td) vaccine.** / 1 dose of Td every 10 years.  Varicella vaccine.** / Consult your health care provider.  Zoster vaccine.** / 1 dose for adults aged 61 years or older.  Pneumococcal 13-valent conjugate (PCV13) vaccine.** / Consult your health care provider.  Pneumococcal  polysaccharide (PPSV23) vaccine.** / 1 dose for all adults aged 28 years and older.  Meningococcal vaccine.** / Consult your health care provider.  Hepatitis A vaccine.** / Consult your health care provider.  Hepatitis B vaccine.** / Consult your health care provider.  Haemophilus influenzae type b (Hib) vaccine.** / Consult your health care provider. ** Family history and personal history of risk and conditions may change your health care provider's recommendations. Document Released: 12/03/2001 Document Revised: 02/21/2014 Document Reviewed: 03/04/2011 [facility] Patient Information 2015 [place], [place]. This information is not intended to replace advice given to you by your health care provider. Make sure you discuss any questions you have with your health care provider.

## 2014-08-15 NOTE — Progress Notes (Signed)
Pre visit review using our clinic review tool, if applicable. No additional management support is needed unless otherwise documented below in the visit note. 

## 2014-08-21 NOTE — Assessment & Plan Note (Signed)
Patient encouraged to maintain heart healthy diet, regular exercise, adequate sleep. Consider daily probiotics. Take medications as prescribed. Declines flu shot. Labs ordered and reviewed today

## 2014-08-21 NOTE — Assessment & Plan Note (Signed)
Doing well without meds continue same lifestyle modifications

## 2014-08-21 NOTE — Progress Notes (Signed)
Kristen Cooke [id number] 02/04/1961 08/21/2014      Progress Note-Follow Up  Subjective  Chief Complaint  Chief Complaint  Patient presents with  . Annual Exam    physical    HPI  Patient is a 53 year old female in today for routine medical care. Doing well. Has been under stress caring for her older mother but is managing well without meds. No recent illness. Denies CP/palp/SOB/HA/congestion/fevers/GI or GU c/o. Taking meds as prescribed. Is exercising and eating well  Past Medical History  Diagnosis Date  . Chicken pox   . Depression   . Anxiety   . Arthritis     back  . Diarrhea 01/21/2012  . 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 Diverticulosis 01/22/2012  . Skin lesion of left arm 06/15/2012  . Anxiety and depression 01/21/2012  . Reflux 01/21/2012  . Tinea corporis 09/20/2012  . Elevated BP 12/19/2013  . Overweight(278.02) 12/19/2013  . Unspecified hypothyroidism 05/25/2014    Past Surgical History  Procedure Laterality Date  . Abdominal hysterectomy  2002    vaginal, partial  . Tonsillectomy  1967  . Titanium rod insertion  1978    right femur  . Titanium rod removal  1980    bone graph, re-insertion  . Titanium rod removal  1981  . Ganglion cyst excision  1987    right wrist  . Bunionectomy  2004    b/l    Family History  Problem Relation Age of Onset  . Hypertension Father   . Hyperlipidemia Father   . Coronary artery disease Father   . Hypertension Mother   . Osteoarthritis Mother   . Osteoporosis Mother   . Scoliosis Mother   . Irritable bowel syndrome Mother   . Cancer Maternal Grandmother     Breast  . Pneumonia Maternal Grandmother   . Cancer Paternal Grandmother   . Cancer Paternal Grandfather     colon  . Colon cancer Paternal Grandfather   . Hypothyroidism Sister   . Obesity Sister     History   Social History  . Marital Status: Widowed    Spouse Name: N/A    Number of Children: N/A  . Years of Education: N/A   Occupational History  .  Not on file.   Social History Main Topics  . Smoking status: Never Smoker   . Smokeless tobacco: Never Used  . Alcohol Use: 0.0 oz/week    7-14 drink(s) per week     Comment: 1-2 daily  . Drug Use: No  . Sexual Activity:    Partners: Male   Other Topics Concern  . Not on file   Social History Narrative  . No narrative on file    Current Outpatient Prescriptions on File Prior to Visit  Medication Sig Dispense Refill  . clotrimazole (QC CLOTRIMAZOLE) 1 % cream Apply topically 2 (two) times daily. 45 g 1  . lactobacillus acidophilus & bulgar (LACTINEX) chewable tablet Chew 1 tablet by mouth 3 (three) times daily with meals. 90 tablet 1  . Naproxen (NAPROSYN PO) Take by mouth as needed.      . NON FORMULARY Take 10 mg by mouth as needed. allerclear     . triamcinolone cream (KENALOG) 0.1 %      No current facility-administered medications on file prior to visit.    Allergies  Allergen Reactions  . Amoxicillin   . Sulfonamide Derivatives     Review of Systems  Review of Systems  Constitutional: Negative for  fever, chills and malaise/fatigue.  HENT: Negative for congestion, hearing loss and nosebleeds.   Eyes: Negative for discharge.  Respiratory: Negative for cough, sputum production, shortness of breath and wheezing.   Cardiovascular: Negative for chest pain, palpitations and leg swelling.  Gastrointestinal: Negative for heartburn, nausea, vomiting, abdominal pain, diarrhea, constipation and blood in stool.  Genitourinary: Negative for dysuria, urgency, frequency and hematuria.  Musculoskeletal: Negative for myalgias, back pain and falls.  Skin: Negative for rash.  Neurological: Negative for dizziness, tremors, sensory change, focal weakness, loss of consciousness, weakness and headaches.  Endo/Heme/Allergies: Negative for polydipsia. Does not bruise/bleed easily.  Psychiatric/Behavioral: Negative for depression and suicidal ideas. The patient is not nervous/anxious and  does not have insomnia.     Objective  BP 125/72 mmHg  Pulse 72  Temp(Src) 98.3 F (36.8 C) (Oral)  Ht 5' 4.25" (1.632 m)  Wt 140 lb 12.8 oz (63.866 kg)  BMI 23.98 kg/m2  SpO2 100%  Physical Exam  Physical Exam  Constitutional: She is oriented to person, place, and time and well-developed, well-nourished, and in no distress. No distress.  HENT:  Head: Normocephalic and atraumatic.  Right Ear: External ear normal.  Left Ear: External ear normal.  Nose: Nose normal.  Mouth/Throat: Oropharynx is clear and moist. No oropharyngeal exudate.  Eyes: Conjunctivae are normal. Pupils are equal, round, and reactive to light. Right eye exhibits no discharge. Left eye exhibits no discharge. No scleral icterus.  Neck: Normal range of motion. Neck supple. No thyromegaly present.  Cardiovascular: Normal rate, regular rhythm, normal heart sounds and intact distal pulses.   No murmur heard. Pulmonary/Chest: Effort normal and breath sounds normal. No respiratory distress. She has no wheezes. She has no rales.  Abdominal: Soft. Bowel sounds are normal. She exhibits no distension and no mass. There is no tenderness.  Musculoskeletal: Normal range of motion. She exhibits no edema or tenderness.  Lymphadenopathy:    She has no cervical adenopathy.  Neurological: She is alert and oriented to person, place, and time. She has normal reflexes. No cranial nerve deficit. Coordination normal.  Skin: Skin is warm and dry. No rash noted. She is not diaphoretic.  Psychiatric: Mood, memory and affect normal.    Lab Results  Component Value Date   TSH 1.88 08/15/2014   Lab Results  Component Value Date   WBC 5.9 08/15/2014   HGB 14.4 08/15/2014   HCT 43.4 08/15/2014   MCV 95.3 08/15/2014   PLT 211.0 08/15/2014   Lab Results  Component Value Date   CREATININE 0.6 08/15/2014   BUN 20 08/15/2014   NA 137 08/15/2014   K 4.0 08/15/2014   CL 101 08/15/2014   CO2 30 08/15/2014   Lab Results   Component Value Date   ALT 47* 08/15/2014   AST 39* 08/15/2014   ALKPHOS 50 08/15/2014   BILITOT 0.7 08/15/2014   Lab Results  Component Value Date   CHOL 216* 08/15/2014   Lab Results  Component Value Date   HDL 87.00 08/15/2014   Lab Results  Component Value Date   LDLCALC 115* 08/15/2014   Lab Results  Component Value Date   TRIG 72.0 08/15/2014   Lab Results  Component Value Date   CHOLHDL 2 08/15/2014     Assessment & Plan  Elevated BP Well controlled. Encouraged heart healthy diet such as the DASH diet and exercise as tolerated.   Abnormal results of thyroid function studies Mild, stable, work up neg will monitor  Anxiety and  depression Doing well without meds continue same lifestyle modifications  Preventative health care Patient encouraged to maintain heart healthy diet, regular exercise, adequate sleep. Consider daily probiotics. Take medications as prescribed. Declines flu shot. Labs ordered and reviewed today

## 2014-08-21 NOTE — Assessment & Plan Note (Signed)
Well controlled. Encouraged heart healthy diet such as the DASH diet and exercise as tolerated.  

## 2014-08-21 NOTE — Assessment & Plan Note (Signed)
Mild, stable, work up neg will monitor

## 2014-09-05 ENCOUNTER — Other Ambulatory Visit: Payer: Self-pay | Admitting: Family Medicine

## 2014-09-05 ENCOUNTER — Encounter: Payer: Self-pay | Admitting: Family Medicine

## 2014-09-05 DIAGNOSIS — G4700 Insomnia, unspecified: Secondary | ICD-10-CM

## 2014-09-05 MED ORDER — TRAZODONE HCL 50 MG PO TABS: 25.0000 mg | ORAL_TABLET | Freq: Every evening | ORAL | Status: DC | PRN

## 2015-01-20 LAB — HM MAMMOGRAPHY: HM Mammogram: NORMAL

## 2015-08-16 ENCOUNTER — Telehealth: Payer: Self-pay | Admitting: Behavioral Health

## 2015-08-16 ENCOUNTER — Encounter: Payer: Self-pay | Admitting: Behavioral Health

## 2015-08-16 NOTE — Telephone Encounter (Signed)
Pre-Visit Call completed with patient and chart updated.   Pre-Visit Info documented in Specialty Comments under [employer].    

## 2015-08-17 ENCOUNTER — Encounter: Payer: Self-pay | Admitting: Family Medicine

## 2015-08-17 ENCOUNTER — Ambulatory Visit (INDEPENDENT_AMBULATORY_CARE_PROVIDER_SITE_OTHER): Payer: BLUE CROSS/BLUE SHIELD | Admitting: Family Medicine

## 2015-08-17 VITALS — BP 128/70 | HR 81 | Temp 98.3°F | Ht 63.0 in | Wt 146.2 lb

## 2015-08-17 DIAGNOSIS — L989 Disorder of the skin and subcutaneous tissue, unspecified: Secondary | ICD-10-CM

## 2015-08-17 DIAGNOSIS — K219 Gastro-esophageal reflux disease without esophagitis: Secondary | ICD-10-CM

## 2015-08-17 DIAGNOSIS — IMO0001 Reserved for inherently not codable concepts without codable children: Secondary | ICD-10-CM

## 2015-08-17 DIAGNOSIS — Z0000 Encounter for general adult medical examination without abnormal findings: Secondary | ICD-10-CM | POA: Diagnosis not present

## 2015-08-17 DIAGNOSIS — R030 Elevated blood-pressure reading, without diagnosis of hypertension: Secondary | ICD-10-CM

## 2015-08-17 DIAGNOSIS — E782 Mixed hyperlipidemia: Secondary | ICD-10-CM

## 2015-08-17 LAB — COMPREHENSIVE METABOLIC PANEL
ALBUMIN: 4.3 g/dL (ref 3.5–5.2)
ALT: 28 U/L (ref 0–35)
AST: 27 U/L (ref 0–37)
Alkaline Phosphatase: 57 U/L (ref 39–117)
BUN: 13 mg/dL (ref 6–23)
CALCIUM: 9.9 mg/dL (ref 8.4–10.5)
CO2: 29 meq/L (ref 19–32)
CREATININE: 0.65 mg/dL (ref 0.40–1.20)
Chloride: 101 mEq/L (ref 96–112)
GFR: 100.34 mL/min (ref >60.00)
Glucose, Bld: 98 mg/dL (ref 70–99)
Potassium: 4.5 mEq/L (ref 3.5–5.1)
Sodium: 140 mEq/L (ref 135–145)
Total Bilirubin: 0.4 mg/dL (ref 0.2–1.2)
Total Protein: 7.2 g/dL (ref 6.0–8.3)

## 2015-08-17 LAB — TSH: TSH: 3.11 u[IU]/mL (ref 0.35–4.50)

## 2015-08-17 LAB — CBC
HCT: 42.7 % (ref 36.0–46.0)
Hemoglobin: 14.4 g/dL (ref 12.0–15.0)
MCHC: 33.6 g/dL (ref 30.0–36.0)
MCV: 94.2 fl (ref 78.0–100.0)
PLATELETS: 186 10*3/uL (ref 150.0–400.0)
RBC: 4.53 Mil/uL (ref 3.87–5.11)
RDW: 13.1 % (ref 11.5–15.5)
WBC: 7.5 10*3/uL (ref 4.0–10.5)

## 2015-08-17 LAB — LIPID PANEL
CHOL/HDL RATIO: 2
CHOLESTEROL: 200 mg/dL (ref 0–200)
HDL: 89.7 mg/dL (ref >39.00)
LDL CALC: 95 mg/dL (ref 0–99)
NonHDL: 110.12
Triglycerides: 74 mg/dL (ref 0.0–149.0)
VLDL: 14.8 mg/dL (ref 0.0–40.0)

## 2015-08-17 NOTE — Progress Notes (Signed)
Pre visit review using our clinic review tool, if applicable. No additional management support is needed unless otherwise documented below in the visit note. 

## 2015-08-17 NOTE — Assessment & Plan Note (Signed)
No complaints today.

## 2015-08-17 NOTE — Progress Notes (Signed)
Subjective:    Patient ID: Kristen Cooke, female    DOB: 11/24/1960, 54 y.o.   MRN: [id number]  Chief Complaint  Patient presents with  . Annual Exam    HPI Patient is in today for annual exam. Overall doing well. Continues to struggle with some low back pain but it is tolerable. Trazodone does help her fall asleep but she has trouble staying asleep. She has been walking intermittently. [facility] No GYN complaints. No recent illness. Denies polyuria and polydipsia. Denies CP/palp/SOB/HA/congestion/fevers/GI or GU c/o. Taking meds as prescribed  Past Medical History  Diagnosis Date  . Chicken pox   . Depression   . Anxiety   . Arthritis     back  . Diarrhea 01/21/2012  . 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 Diverticulosis 01/22/2012  . Skin lesion of left arm 06/15/2012  . Anxiety and depression 01/21/2012  . Reflux 01/21/2012  . Tinea corporis 09/20/2012  . Elevated BP 12/19/2013  . Overweight(278.02) 12/19/2013  . Unspecified hypothyroidism 05/25/2014  . Skin lesion 06/15/2012    Dr Danella DeisGruber     Past Surgical History  Procedure Laterality Date  . Abdominal hysterectomy  2002    vaginal, partial  . Tonsillectomy  1967  . Titanium rod insertion  1978    right femur  . Titanium rod removal  1980    bone graph, re-insertion  . Titanium rod removal  1981  . Ganglion cyst excision  1987    right wrist  . Bunionectomy  2004    b/l    Family History  Problem Relation Age of Onset  . Hypertension Father   . Hyperlipidemia Father   . Coronary artery disease Father   . Hypertension Mother   . Osteoarthritis Mother   . Osteoporosis Mother   . Scoliosis Mother   . Irritable bowel syndrome Mother   . Cancer Maternal Grandmother     Breast  . Pneumonia Maternal Grandmother   . Cancer Paternal Grandmother   . Cancer Paternal Grandfather     colon  . Colon cancer Paternal Grandfather   . Hypothyroidism Sister   . Obesity Sister     Social History    Social History  . Marital Status: Widowed    Spouse Name: N/A  . Number of Children: N/A  . Years of Education: N/A   Occupational History  . Not on file.   Social History Main Topics  . Smoking status: Never Smoker   . Smokeless tobacco: Never Used  . Alcohol Use: 0.0 oz/week    7-14 Standard drinks or equivalent per week     Comment: 1-2 daily  . Drug Use: No  . Sexual Activity:    Partners: Male   Other Topics Concern  . Not on file   Social History Narrative    Outpatient Prescriptions Prior to Visit  Medication Sig Dispense Refill  . Ascorbic Acid (VITAMIN C PO) Take 1 tablet by mouth daily.    . NON FORMULARY Take 10 mg by mouth as needed. allerclear     . Probiotic Product (PROBIOTIC DAILY PO) Take 1 capsule by mouth daily.    . traZODone (DESYREL) 50 MG tablet Take 0.5-1 tablets (25-50 mg total) by mouth at bedtime as needed for sleep. 30 tablet 4  . TURMERIC PO Take 1 tablet by mouth daily.     No facility-administered medications prior to visit.    Allergies  Allergen Reactions  . Amoxicillin   .  Sulfonamide Derivatives     Review of Systems  Constitutional: Negative for fever, chills and malaise/fatigue.  HENT: Negative for congestion and hearing loss.   Eyes: Negative for discharge.  Respiratory: Negative for cough, sputum production and shortness of breath.   Cardiovascular: Negative for chest pain, palpitations and leg swelling.  Gastrointestinal: Negative for heartburn, nausea, vomiting, abdominal pain, diarrhea, constipation and blood in stool.  Genitourinary: Negative for dysuria, urgency, frequency and hematuria.  Musculoskeletal: Negative for myalgias, back pain and falls.  Skin: Negative for rash.  Neurological: Negative for dizziness, sensory change, loss of consciousness, weakness and headaches.  Endo/Heme/Allergies: Negative for environmental allergies. Does not bruise/bleed easily.  Psychiatric/Behavioral: Negative for depression and  suicidal ideas. The patient is not nervous/anxious and does not have insomnia.        Objective:    Physical Exam  Constitutional: She is oriented to person, place, and time. She appears well-developed and well-nourished. No distress.  HENT:  Head: Normocephalic and atraumatic.  Eyes: Conjunctivae are normal.  Neck: Neck supple. No thyromegaly present.  Cardiovascular: Normal rate, regular rhythm and normal heart sounds.   No murmur heard. Pulmonary/Chest: Effort normal and breath sounds normal. No respiratory distress.  Abdominal: Soft. Bowel sounds are normal. She exhibits no distension and no mass. There is no tenderness.  Musculoskeletal: She exhibits no edema.  Lymphadenopathy:    She has no cervical adenopathy.  Neurological: She is alert and oriented to person, place, and time.  Skin: Skin is warm and dry.  Right foot firm, circumscribed lesion top of foot, overlaying 4 th metatarsal. Erythematous, nontender  Psychiatric: She has a normal mood and affect. Her behavior is normal.    BP 128/70 mmHg  Pulse 81  Temp(Src) 98.3 F (36.8 C) (Oral)  Ht  (1.6 m)  Wt 146 lb 4 oz (66.339 kg)  BMI 25.91 kg/m2  SpO2 97% Wt Readings from Last 3 Encounters:  08/17/15 146 lb 4 oz (66.339 kg)  08/15/14 140 lb 12.8 oz (63.866 kg)  02/15/14 151 lb 1.3 oz (68.529 kg)     Lab Results  Component Value Date   WBC 7.5 08/17/2015   HGB 14.4 08/17/2015   HCT 42.7 08/17/2015   PLT 186.0 08/17/2015   GLUCOSE 98 08/17/2015   CHOL 200 08/17/2015   TRIG 74.0 08/17/2015   HDL 89.70 08/17/2015   LDLDIRECT 114.0 06/16/2013   LDLCALC 95 08/17/2015   ALT 28 08/17/2015   AST 27 08/17/2015   NA 140 08/17/2015   K 4.5 08/17/2015   CL 101 08/17/2015   CREATININE 0.65 08/17/2015   BUN 13 08/17/2015   CO2 29 08/17/2015   TSH 3.11 08/17/2015    Lab Results  Component Value Date   TSH 3.11 08/17/2015   Lab Results  Component Value Date   WBC 7.5 08/17/2015   HGB 14.4 08/17/2015     HCT 42.7 08/17/2015   MCV 94.2 08/17/2015   PLT 186.0 08/17/2015   Lab Results  Component Value Date   NA 140 08/17/2015   K 4.5 08/17/2015   CO2 29 08/17/2015   GLUCOSE 98 08/17/2015   BUN 13 08/17/2015   CREATININE 0.65 08/17/2015   BILITOT 0.4 08/17/2015   ALKPHOS 57 08/17/2015   AST 27 08/17/2015   ALT 28 08/17/2015   PROT 7.2 08/17/2015   ALBUMIN 4.3 08/17/2015   CALCIUM 9.9 08/17/2015   GFR 100.34 08/17/2015   Lab Results  Component Value Date   CHOL 200 08/17/2015  Lab Results  Component Value Date   HDL 89.70 08/17/2015   Lab Results  Component Value Date   LDLCALC 95 08/17/2015   Lab Results  Component Value Date   TRIG 74.0 08/17/2015   Lab Results  Component Value Date   CHOLHDL 2 08/17/2015   No results found for: HGBA1C     Assessment & Plan:   Problem List Items Addressed This Visit    Skin lesion - Primary    Right foot, present 1 year, referred back to dermatology for excision      Relevant Orders   Ambulatory referral to Dermatology   TSH (Completed)   CBC (Completed)   Comprehensive metabolic panel (Completed)   Lipid panel (Completed)   Reflux    No complaints today      Preventative health care    Patient encouraged to maintain heart healthy diet, regular exercise, adequate sleep. Consider daily probiotics. Take medications as prescribed. Sees Dr Ilda Mori of GYN for paps, mgm Colonoscopy 10/02/11 by Dr Russella Dar, 10 year recall Dermatology Dr Danella Deis      Relevant Orders   TSH (Completed)   CBC (Completed)   Comprehensive metabolic panel (Completed)   Lipid panel (Completed)   Elevated BP    Well controlled, no changes to meds. Encouraged heart healthy diet such as the DASH diet and exercise as tolerated.       Relevant Orders   TSH (Completed)   CBC (Completed)   Comprehensive metabolic panel (Completed)   Lipid panel (Completed)    Other Visit Diagnoses    Hyperlipidemia, mixed           I am having Ms.  Schirtzinger maintain her NON FORMULARY, traZODone, Probiotic Product (PROBIOTIC DAILY PO), TURMERIC PO, and Ascorbic Acid (VITAMIN C PO).  No orders of the defined types were placed in this encounter.     Danise Edge, MD

## 2015-08-17 NOTE — Assessment & Plan Note (Signed)
Well controlled, no changes to meds. Encouraged heart healthy diet such as the DASH diet and exercise as tolerated.  °

## 2015-08-17 NOTE — Assessment & Plan Note (Signed)
Right foot, present 1 year, referred back to dermatology for excision

## 2015-08-17 NOTE — Patient Instructions (Addendum)
Encouraged good sleep hygiene such as dark, quiet room. No blue/green glowing lights such as computer screens in bedroom. No alcohol or stimulants in evening. Cut down on caffeine as able. Regular exercise is helpful but not just prior to bed time.   Melatonin 2-10 mg tabs po qhs   Can consider asking insurance if they will pay for Hep C screening per CDC recommendations, if so for what diagnostic code  Preventive Care for Adults, Female A healthy lifestyle and preventive care can promote health and wellness. Preventive health guidelines for women include the following key practices.  A routine yearly physical is a good way to check with your health care provider about your health and preventive screening. It is a chance to share any concerns and updates on your health and to receive a thorough exam.  Visit your dentist for a routine exam and preventive care every 6 months. Brush your teeth twice a day and floss once a day. Good oral hygiene prevents tooth decay and gum disease.  The frequency of eye exams is based on your age, health, family medical history, use of contact lenses, and other factors. Follow your health care provider's recommendations for frequency of eye exams.  Eat a healthy diet. Foods like vegetables, fruits, whole grains, low-fat dairy products, and lean protein foods contain the nutrients you need without too many calories. Decrease your intake of foods high in solid fats, added sugars, and salt. Eat the right amount of calories for you.Get information about a proper diet from your health care provider, if necessary.  Regular physical exercise is one of the most important things you can do for your health. Most adults should get at least 150 minutes of moderate-intensity exercise (any activity that increases your heart rate and causes you to sweat) each week. In addition, most adults need muscle-strengthening exercises on 2 or more days a week.  Maintain a healthy weight.  The body mass index (BMI) is a screening tool to identify possible weight problems. It provides an estimate of body fat based on height and weight. Your health care provider can find your BMI and can help you achieve or maintain a healthy weight.For adults 20 years and older:  A BMI below 18.5 is considered underweight.  A BMI of 18.5 to 24.9 is normal.  A BMI of 25 to 29.9 is considered overweight.  A BMI of 30 and above is considered obese.  Maintain normal blood lipids and cholesterol levels by exercising and minimizing your intake of saturated fat. Eat a balanced diet with plenty of fruit and vegetables. Blood tests for lipids and cholesterol should begin at age 42 and be repeated every 5 years. If your lipid or cholesterol levels are high, you are over 50, or you are at high risk for heart disease, you may need your cholesterol levels checked more frequently.Ongoing high lipid and cholesterol levels should be treated with medicines if diet and exercise are not working.  If you smoke, find out from your health care provider how to quit. If you do not use tobacco, do not start.  Lung cancer screening is recommended for adults aged 64-80 years who are at high risk for developing lung cancer because of a history of smoking. A yearly low-dose CT scan of the lungs is recommended for people who have at least a 30-pack-year history of smoking and are a current smoker or have quit within the past 15 years. A pack year of smoking is smoking an average of  1 pack of cigarettes a day for 1 year (for example: 1 pack a day for 30 years or 2 packs a day for 15 years). Yearly screening should continue until the smoker has stopped smoking for at least 15 years. Yearly screening should be stopped for people who develop a health problem that would prevent them from having lung cancer treatment.  If you are pregnant, do not drink alcohol. If you are breastfeeding, be very cautious about drinking alcohol. If you  are not pregnant and choose to drink alcohol, do not have more than 1 drink per day. One drink is considered to be 12 ounces (355 mL) of beer, 5 ounces (148 mL) of wine, or 1.5 ounces (44 mL) of liquor.  Avoid use of street drugs. Do not share needles with anyone. Ask for help if you need support or instructions about stopping the use of drugs.  High blood pressure causes heart disease and increases the risk of stroke. Your blood pressure should be checked at least every 1 to 2 years. Ongoing high blood pressure should be treated with medicines if weight loss and exercise do not work.  If you are 47-97 years old, ask your health care provider if you should take aspirin to prevent strokes.  Diabetes screening is done by taking a blood sample to check your blood glucose level after you have not eaten for a certain period of time (fasting). If you are not overweight and you do not have risk factors for diabetes, you should be screened once every 3 years starting at age 63. If you are overweight or obese and you are 13-68 years of age, you should be screened for diabetes every year as part of your cardiovascular risk assessment.  Breast cancer screening is essential preventive care for women. You should practice "breast self-awareness." This means understanding the normal appearance and feel of your breasts and may include breast self-examination. Any changes detected, no matter how small, should be reported to a health care provider. Women in their 16s and 30s should have a clinical breast exam (CBE) by a health care provider as part of a regular health exam every 1 to 3 years. After age 96, women should have a CBE every year. Starting at age 3, women should consider having a mammogram (breast X-ray test) every year. Women who have a family history of breast cancer should talk to their health care provider about genetic screening. Women at a high risk of breast cancer should talk to their health care  providers about having an MRI and a mammogram every year.  Breast cancer gene (BRCA)-related cancer risk assessment is recommended for women who have family members with BRCA-related cancers. BRCA-related cancers include breast, ovarian, tubal, and peritoneal cancers. Having family members with these cancers may be associated with an increased risk for harmful changes (mutations) in the breast cancer genes BRCA1 and BRCA2. Results of the assessment will determine the need for genetic counseling and BRCA1 and BRCA2 testing.  Your health care provider may recommend that you be screened regularly for cancer of the pelvic organs (ovaries, uterus, and vagina). This screening involves a pelvic examination, including checking for microscopic changes to the surface of your cervix (Pap test). You may be encouraged to have this screening done every 3 years, beginning at age 104.  For women ages 31-65, health care providers may recommend pelvic exams and Pap testing every 3 years, or they may recommend the Pap and pelvic exam, combined with testing for  human papilloma virus (HPV), every 5 years. Some types of HPV increase your risk of cervical cancer. Testing for HPV may also be done on women of any age with unclear Pap test results.  Other health care providers may not recommend any screening for nonpregnant women who are considered low risk for pelvic cancer and who do not have symptoms. Ask your health care provider if a screening pelvic exam is right for you.  If you have had past treatment for cervical cancer or a condition that could lead to cancer, you need Pap tests and screening for cancer for at least 20 years after your treatment. If Pap tests have been discontinued, your risk factors (such as having a new sexual partner) need to be reassessed to determine if screening should resume. Some women have medical problems that increase the chance of getting cervical cancer. In these cases, your health care  provider may recommend more frequent screening and Pap tests.  Colorectal cancer can be detected and often prevented. Most routine colorectal cancer screening begins at the age of 56 years and continues through age 7 years. However, your health care provider may recommend screening at an earlier age if you have risk factors for colon cancer. On a yearly basis, your health care provider may provide home test kits to check for hidden blood in the stool. Use of a small camera at the end of a tube, to directly examine the colon (sigmoidoscopy or colonoscopy), can detect the earliest forms of colorectal cancer. Talk to your health care provider about this at age 44, when routine screening begins. Direct exam of the colon should be repeated every 5-10 years through age 48 years, unless early forms of precancerous polyps or small growths are found.  People who are at an increased risk for hepatitis B should be screened for this virus. You are considered at high risk for hepatitis B if:  You were born in a country where hepatitis B occurs often. Talk with your health care provider about which countries are considered high risk.  Your parents were born in a high-risk country and you have not received a shot to protect against hepatitis B (hepatitis B vaccine).  You have HIV or AIDS.  You use needles to inject street drugs.  You live with, or have sex with, someone who has hepatitis B.  You get hemodialysis treatment.  You take certain medicines for conditions like cancer, organ transplantation, and autoimmune conditions.  Hepatitis C blood testing is recommended for all people born from [birth year] through 1965 and any individual with known risks for hepatitis C.  Practice safe sex. Use condoms and avoid high-risk sexual practices to reduce the spread of sexually transmitted infections (STIs). STIs include gonorrhea, chlamydia, syphilis, trichomonas, herpes, HPV, and human immunodeficiency virus (HIV).  Herpes, HIV, and HPV are viral illnesses that have no cure. They can result in disability, cancer, and death.  You should be screened for sexually transmitted illnesses (STIs) including gonorrhea and chlamydia if:  You are sexually active and are younger than 24 years.  You are older than 24 years and your health care provider tells you that you are at risk for this type of infection.  Your sexual activity has changed since you were last screened and you are at an increased risk for chlamydia or gonorrhea. Ask your health care provider if you are at risk.  If you are at risk of being infected with HIV, it is recommended that you take a prescription  medicine daily to prevent HIV infection. This is called preexposure prophylaxis (PrEP). You are considered at risk if:  You are sexually active and do not regularly use condoms or know the HIV status of your partner(s).  You take drugs by injection.  You are sexually active with a partner who has HIV.  Talk with your health care provider about whether you are at high risk of being infected with HIV. If you choose to begin PrEP, you should first be tested for HIV. You should then be tested every 3 months for as long as you are taking PrEP.  Osteoporosis is a disease in which the bones lose minerals and strength with aging. This can result in serious bone fractures or breaks. The risk of osteoporosis can be identified using a bone density scan. Women ages 79 years and over and women at risk for fractures or osteoporosis should discuss screening with their health care providers. Ask your health care provider whether you should take a calcium supplement or vitamin D to reduce the rate of osteoporosis.  Menopause can be associated with physical symptoms and risks. Hormone replacement therapy is available to decrease symptoms and risks. You should talk to your health care provider about whether hormone replacement therapy is right for you.  Use  sunscreen. Apply sunscreen liberally and repeatedly throughout the day. You should seek shade when your shadow is shorter than you. Protect yourself by wearing long sleeves, pants, a wide-brimmed hat, and sunglasses year round, whenever you are outdoors.  Once a month, do a whole body skin exam, using a mirror to look at the skin on your back. Tell your health care provider of new moles, moles that have irregular borders, moles that are larger than a pencil eraser, or moles that have changed in shape or color.  Stay current with required vaccines (immunizations).  Influenza vaccine. All adults should be immunized every year.  Tetanus, diphtheria, and acellular pertussis (Td, Tdap) vaccine. Pregnant women should receive 1 dose of Tdap vaccine during each pregnancy. The dose should be obtained regardless of the length of time since the last dose. Immunization is preferred during the 27th-36th week of gestation. An adult who has not previously received Tdap or who does not know her vaccine status should receive 1 dose of Tdap. This initial dose should be followed by tetanus and diphtheria toxoids (Td) booster doses every 10 years. Adults with an unknown or incomplete history of completing a 3-dose immunization series with Td-containing vaccines should begin or complete a primary immunization series including a Tdap dose. Adults should receive a Td booster every 10 years.  Varicella vaccine. An adult without evidence of immunity to varicella should receive 2 doses or a second dose if she has previously received 1 dose. Pregnant females who do not have evidence of immunity should receive the first dose after pregnancy. This first dose should be obtained before leaving the health care facility. The second dose should be obtained 4-8 weeks after the first dose.  Human papillomavirus (HPV) vaccine. Females aged 13-26 years who have not received the vaccine previously should obtain the 3-dose series. The vaccine  is not recommended for use in pregnant females. However, pregnancy testing is not needed before receiving a dose. If a female is found to be pregnant after receiving a dose, no treatment is needed. In that case, the remaining doses should be delayed until after the pregnancy. Immunization is recommended for any person with an immunocompromised condition through the age of 54  years if she did not get any or all doses earlier. During the 3-dose series, the second dose should be obtained 4-8 weeks after the first dose. The third dose should be obtained 24 weeks after the first dose and 16 weeks after the second dose.  Zoster vaccine. One dose is recommended for adults aged 68 years or older unless certain conditions are present.  Measles, mumps, and rubella (MMR) vaccine. Adults born before [birth year] generally are considered immune to measles and mumps. Adults born in [birth year] or later should have 1 or more doses of MMR vaccine unless there is a contraindication to the vaccine or there is laboratory evidence of immunity to each of the three diseases. A routine second dose of MMR vaccine should be obtained at least 28 days after the first dose for students attending postsecondary schools, health care workers, or international travelers. People who received inactivated measles vaccine or an unknown type of measles vaccine during 1963-1967 should receive 2 doses of MMR vaccine. People who received inactivated mumps vaccine or an unknown type of mumps vaccine before 1979 and are at high risk for mumps infection should consider immunization with 2 doses of MMR vaccine. For females of childbearing age, rubella immunity should be determined. If there is no evidence of immunity, females who are not pregnant should be vaccinated. If there is no evidence of immunity, females who are pregnant should delay immunization until after pregnancy. Unvaccinated health care workers born before [birth year] who lack laboratory evidence of measles,  mumps, or rubella immunity or laboratory confirmation of disease should consider measles and mumps immunization with 2 doses of MMR vaccine or rubella immunization with 1 dose of MMR vaccine.  Pneumococcal 13-valent conjugate (PCV13) vaccine. When indicated, a person who is uncertain of his immunization history and has no record of immunization should receive the PCV13 vaccine. All adults 71 years of age and older should receive this vaccine. An adult aged 27 years or older who has certain medical conditions and has not been previously immunized should receive 1 dose of PCV13 vaccine. This PCV13 should be followed with a dose of pneumococcal polysaccharide (PPSV23) vaccine. Adults who are at high risk for pneumococcal disease should obtain the PPSV23 vaccine at least 8 weeks after the dose of PCV13 vaccine. Adults older than 56 years of age who have normal immune system function should obtain the PPSV23 vaccine dose at least 1 year after the dose of PCV13 vaccine.  Pneumococcal polysaccharide (PPSV23) vaccine. When PCV13 is also indicated, PCV13 should be obtained first. All adults aged 57 years and older should be immunized. An adult younger than age 68 years who has certain medical conditions should be immunized. Any person who resides in a nursing home or long-term care facility should be immunized. An adult smoker should be immunized. People with an immunocompromised condition and certain other conditions should receive both PCV13 and PPSV23 vaccines. People with human immunodeficiency virus (HIV) infection should be immunized as soon as possible after diagnosis. Immunization during chemotherapy or radiation therapy should be avoided. Routine use of PPSV23 vaccine is not recommended for American Indians, [place] Natives, or people younger than 65 years unless there are medical conditions that require PPSV23 vaccine. When indicated, people who have unknown immunization and have no record of immunization should  receive PPSV23 vaccine. One-time revaccination 5 years after the first dose of PPSV23 is recommended for people aged 19-64 years who have chronic kidney failure, nephrotic syndrome, asplenia, or immunocompromised conditions. People who received 1-2  doses of PPSV23 before age 67 years should receive another dose of PPSV23 vaccine at age 37 years or later if at least 5 years have passed since the previous dose. Doses of PPSV23 are not needed for people immunized with PPSV23 at or after age 19 years.  Meningococcal vaccine. Adults with asplenia or persistent complement component deficiencies should receive 2 doses of quadrivalent meningococcal conjugate (MenACWY-D) vaccine. The doses should be obtained at least 2 months apart. Microbiologists working with certain meningococcal bacteria, [place] recruits, people at risk during an outbreak, and people who travel to or live in countries with a high rate of meningitis should be immunized. A first-year college student up through age 46 years who is living in a residence hall should receive a dose if she did not receive a dose on or after her 16th birthday. Adults who have certain high-risk conditions should receive one or more doses of vaccine.  Hepatitis A vaccine. Adults who wish to be protected from this disease, have certain high-risk conditions, work with hepatitis A-infected animals, work in hepatitis A research labs, or travel to or work in countries with a high rate of hepatitis A should be immunized. Adults who were previously unvaccinated and who anticipate close contact with an international adoptee during the first 60 days after arrival in the [place] from a country with a high rate of hepatitis A should be immunized.  Hepatitis B vaccine. Adults who wish to be protected from this disease, have certain high-risk conditions, may be exposed to blood or other infectious body fluids, are household contacts or sex partners of hepatitis B positive people,  are clients or workers in certain care facilities, or travel to or work in countries with a high rate of hepatitis B should be immunized.  Haemophilus influenzae type b (Hib) vaccine. A previously unvaccinated person with asplenia or sickle cell disease or having a scheduled splenectomy should receive 1 dose of Hib vaccine. Regardless of previous immunization, a recipient of a hematopoietic stem cell transplant should receive a 3-dose series 6-12 months after her successful transplant. Hib vaccine is not recommended for adults with HIV infection. Preventive Services / Frequency Ages 50 to 89 years  Blood pressure check.** / Every 3-5 years.  Lipid and cholesterol check.** / Every 5 years beginning at age 40.  Clinical breast exam.** / Every 3 years for women in their 75s and 49s.  BRCA-related cancer risk assessment.** / For women who have family members with a BRCA-related cancer (breast, ovarian, tubal, or peritoneal cancers).  Pap test.** / Every 2 years from ages 86 through 72. Every 3 years starting at age 39 through age 9 or 22 with a history of 3 consecutive normal Pap tests.  HPV screening.** / Every 3 years from ages 35 through ages 43 to 43 with a history of 3 consecutive normal Pap tests.  Hepatitis C blood test.** / For any individual with known risks for hepatitis C.  Skin self-exam. / Monthly.  Influenza vaccine. / Every year.  Tetanus, diphtheria, and acellular pertussis (Tdap, Td) vaccine.** / Consult your health care provider. Pregnant women should receive 1 dose of Tdap vaccine during each pregnancy. 1 dose of Td every 10 years.  Varicella vaccine.** / Consult your health care provider. Pregnant females who do not have evidence of immunity should receive the first dose after pregnancy.  HPV vaccine. / 3 doses over 6 months, if 34 and younger. The vaccine is not recommended for use in pregnant females. However,  pregnancy testing is not needed before receiving a  dose.  Measles, mumps, rubella (MMR) vaccine.** / You need at least 1 dose of MMR if you were born in 1957 or later. You may also need a 2nd dose. For females of childbearing age, rubella immunity should be determined. If there is no evidence of immunity, females who are not pregnant should be vaccinated. If there is no evidence of immunity, females who are pregnant should delay immunization until after pregnancy.  Pneumococcal 13-valent conjugate (PCV13) vaccine.** / Consult your health care provider.  Pneumococcal polysaccharide (PPSV23) vaccine.** / 1 to 2 doses if you smoke cigarettes or if you have certain conditions.  Meningococcal vaccine.** / 1 dose if you are age 76 to 30 years and a Market researcher living in a residence hall, or have one of several medical conditions, you need to get vaccinated against meningococcal disease. You may also need additional booster doses.  Hepatitis A vaccine.** / Consult your health care provider.  Hepatitis B vaccine.** / Consult your health care provider.  Haemophilus influenzae type b (Hib) vaccine.** / Consult your health care provider. Ages 90 to 59 years  Blood pressure check.** / Every year.  Lipid and cholesterol check.** / Every 5 years beginning at age 42 years.  Lung cancer screening. / Every year if you are aged 23-80 years and have a 30-pack-year history of smoking and currently smoke or have quit within the past 15 years. Yearly screening is stopped once you have quit smoking for at least 15 years or develop a health problem that would prevent you from having lung cancer treatment.  Clinical breast exam.** / Every year after age 14 years.  BRCA-related cancer risk assessment.** / For women who have family members with a BRCA-related cancer (breast, ovarian, tubal, or peritoneal cancers).  Mammogram.** / Every year beginning at age 45 years and continuing for as long as you are in good health. Consult with your health care  provider.  Pap test.** / Every 3 years starting at age 63 years through age 107 or 64 years with a history of 3 consecutive normal Pap tests.  HPV screening.** / Every 3 years from ages 23 years through ages 39 to 64 years with a history of 3 consecutive normal Pap tests.  Fecal occult blood test (FOBT) of stool. / Every year beginning at age 77 years and continuing until age 80 years. You may not need to do this test if you get a colonoscopy every 10 years.  Flexible sigmoidoscopy or colonoscopy.** / Every 5 years for a flexible sigmoidoscopy or every 10 years for a colonoscopy beginning at age 67 years and continuing until age 17 years.  Hepatitis C blood test.** / For all people born from [birth year] through 1966 and any individual with known risks for hepatitis C.  Skin self-exam. / Monthly.  Influenza vaccine. / Every year.  Tetanus, diphtheria, and acellular pertussis (Tdap/Td) vaccine.** / Consult your health care provider. Pregnant women should receive 1 dose of Tdap vaccine during each pregnancy. 1 dose of Td every 10 years.  Varicella vaccine.** / Consult your health care provider. Pregnant females who do not have evidence of immunity should receive the first dose after pregnancy.  Zoster vaccine.** / 1 dose for adults aged 36 years or older.  Measles, mumps, rubella (MMR) vaccine.** / You need at least 1 dose of MMR if you were born in 1957 or later. You may also need a second dose. For females of childbearing  age, rubella immunity should be determined. If there is no evidence of immunity, females who are not pregnant should be vaccinated. If there is no evidence of immunity, females who are pregnant should delay immunization until after pregnancy.  Pneumococcal 13-valent conjugate (PCV13) vaccine.** / Consult your health care provider.  Pneumococcal polysaccharide (PPSV23) vaccine.** / 1 to 2 doses if you smoke cigarettes or if you have certain conditions.  Meningococcal vaccine.** /  Consult your health care provider.  Hepatitis A vaccine.** / Consult your health care provider.  Hepatitis B vaccine.** / Consult your health care provider.  Haemophilus influenzae type b (Hib) vaccine.** / Consult your health care provider. Ages 43 years and over  Blood pressure check.** / Every year.  Lipid and cholesterol check.** / Every 5 years beginning at age 42 years.  Lung cancer screening. / Every year if you are aged 72-80 years and have a 30-pack-year history of smoking and currently smoke or have quit within the past 15 years. Yearly screening is stopped once you have quit smoking for at least 15 years or develop a health problem that would prevent you from having lung cancer treatment.  Clinical breast exam.** / Every year after age 80 years.  BRCA-related cancer risk assessment.** / For women who have family members with a BRCA-related cancer (breast, ovarian, tubal, or peritoneal cancers).  Mammogram.** / Every year beginning at age 57 years and continuing for as long as you are in good health. Consult with your health care provider.  Pap test.** / Every 3 years starting at age 47 years through age 57 or 58 years with 3 consecutive normal Pap tests. Testing can be stopped between 65 and 70 years with 3 consecutive normal Pap tests and no abnormal Pap or HPV tests in the past 10 years.  HPV screening.** / Every 3 years from ages 57 years through ages 29 or 69 years with a history of 3 consecutive normal Pap tests. Testing can be stopped between 65 and 70 years with 3 consecutive normal Pap tests and no abnormal Pap or HPV tests in the past 10 years.  Fecal occult blood test (FOBT) of stool. / Every year beginning at age 46 years and continuing until age 83 years. You may not need to do this test if you get a colonoscopy every 10 years.  Flexible sigmoidoscopy or colonoscopy.** / Every 5 years for a flexible sigmoidoscopy or every 10 years for a colonoscopy beginning at age  23 years and continuing until age 71 years.  Hepatitis C blood test.** / For all people born from [birth year] through 1966 and any individual with known risks for hepatitis C.  Osteoporosis screening.** / A one-time screening for women ages 89 years and over and women at risk for fractures or osteoporosis.  Skin self-exam. / Monthly.  Influenza vaccine. / Every year.  Tetanus, diphtheria, and acellular pertussis (Tdap/Td) vaccine.** / 1 dose of Td every 10 years.  Varicella vaccine.** / Consult your health care provider.  Zoster vaccine.** / 1 dose for adults aged 59 years or older.  Pneumococcal 13-valent conjugate (PCV13) vaccine.** / Consult your health care provider.  Pneumococcal polysaccharide (PPSV23) vaccine.** / 1 dose for all adults aged 81 years and older.  Meningococcal vaccine.** / Consult your health care provider.  Hepatitis A vaccine.** / Consult your health care provider.  Hepatitis B vaccine.** / Consult your health care provider.  Haemophilus influenzae type b (Hib) vaccine.** / Consult your health care provider. ** Family history and  personal history of risk and conditions may change your health care provider's recommendations.   This information is not intended to replace advice given to you by your health care provider. Make sure you discuss any questions you have with your health care provider.   Document Released: 12/03/2001 Document Revised: 10/28/2014 Document Reviewed: 03/04/2011 Elsevier Interactive Patient Education [employer].

## 2015-08-17 NOTE — Assessment & Plan Note (Addendum)
Patient encouraged to maintain heart healthy diet, regular exercise, adequate sleep. Consider daily probiotics. Take medications as prescribed. Sees Dr Ilda Moriichard Kaplan of GYN for paps, mgm Colonoscopy 10/02/11 by Dr Russella DarStark, 10 year recall Dermatology Dr Danella DeisGruber

## 2016-01-26 ENCOUNTER — Telehealth: Payer: Self-pay | Admitting: Family Medicine

## 2016-01-26 NOTE — Telephone Encounter (Signed)
Patient Name: Verlon AuLESLIE Jewkes  DOB: 09/13/1961    Initial Comment Caller States has neck pain, feels like she needs to pop it but cant, pain is going up into the back of her head. arm and hand numbness has been going for about 3 weeks but has gotten worse, back of arm aches really bad.    Nurse Assessment  Nurse: Stefano GaulStringer, RN, Dwana CurdVera Date/Time (Eastern Time): 01/26/2016 1:33:48 PM  Confirm and document reason for call. If symptomatic, describe symptoms. You must click the next button to save text entered. ---Caller states she is having neck pain for several weeks. Has numbness in her left pinky, left ring finger, Has pain in her left arm. Has had pain and numbness for several weeks.  Has the patient traveled out of the country within the last 30 days? ---Not Applicable  Does the patient have any new or worsening symptoms? ---Yes  Will a triage be completed? ---Yes  Related visit to physician within the last 2 weeks? ---No  Does the PT have any chronic conditions? (i.e. diabetes, asthma, etc.) ---No  Is this a behavioral health or substance abuse call? ---No     Guidelines    Guideline Title Affirmed Question Affirmed Notes  Neck Pain or Stiffness Pain shoots (radiates) into arm or hand    Final Disposition User   See PCP When Office is Open (within 3 days) Stefano GaulStringer, RN, Dwana CurdVera    Comments  Appt scheduled with Dr. Loreen FreudYvonne Lowne on 01/29/16 at 2:45 pm   Disagree/Comply: Comply

## 2016-01-26 NOTE — Telephone Encounter (Signed)
Appt noted

## 2016-01-26 NOTE — Telephone Encounter (Signed)
Can be reached: [phone]  Reason for call: Pt called in stating neck pain radiating into arm and numbness in hand. [facility]

## 2016-01-29 ENCOUNTER — Ambulatory Visit (INDEPENDENT_AMBULATORY_CARE_PROVIDER_SITE_OTHER): Payer: 59 | Admitting: Family Medicine

## 2016-01-29 ENCOUNTER — Encounter: Payer: Self-pay | Admitting: Family Medicine

## 2016-01-29 ENCOUNTER — Ambulatory Visit (HOSPITAL_BASED_OUTPATIENT_CLINIC_OR_DEPARTMENT_OTHER)
Admission: RE | Admit: 2016-01-29 | Discharge: 2016-01-29 | Disposition: A | Discharge status: Home / Self Care | Payer: 59 | Source: Ambulatory Visit | Attending: Family Medicine | Admitting: Family Medicine

## 2016-01-29 VITALS — BP 100/58 | HR 78 | Temp 99.5°F | Wt 150.4 lb

## 2016-01-29 DIAGNOSIS — M542 Cervicalgia: Secondary | ICD-10-CM | POA: Insufficient documentation

## 2016-01-29 DIAGNOSIS — M5030 Other cervical disc degeneration, unspecified cervical region: Secondary | ICD-10-CM | POA: Diagnosis not present

## 2016-01-29 MED ORDER — CYCLOBENZAPRINE HCL 10 MG PO TABS: 10.0000 mg | ORAL_TABLET | Freq: Three times a day (TID) | ORAL | Status: DC | PRN

## 2016-01-29 NOTE — Progress Notes (Signed)
Patient ID: Kristen Cooke, female    DOB: Aug 31, 1961  Age: 55 y.o. MRN: [id number]    Subjective:  Subjective HPI [facility] presents for numbness and tingling in L arm and neck pain x weeks.  No sob no cp.    Numbness   Review of Systems  Constitutional: Negative for diaphoresis, appetite change, fatigue and unexpected weight change.  Eyes: Negative for pain, redness and visual disturbance.  Respiratory: Negative for cough, chest tightness, shortness of breath and wheezing.   Cardiovascular: Negative for chest pain, palpitations and leg swelling.  Endocrine: Negative for cold intolerance, heat intolerance, polydipsia, polyphagia and polyuria.  Genitourinary: Negative for dysuria, frequency and difficulty urinating.  Musculoskeletal: Positive for neck pain and neck stiffness. Negative for back pain, arthralgias and gait problem.  Neurological: Negative for dizziness, light-headedness, numbness and headaches.    History Past Medical History  Diagnosis Date  . Chicken pox   . Depression   . Anxiety   . Arthritis     back  . Diarrhea 01/21/2012  . 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 Diverticulosis 01/22/2012  . Skin lesion of left arm 06/15/2012  . Anxiety and depression 01/21/2012  . Reflux 01/21/2012  . Tinea corporis 09/20/2012  . Elevated BP 12/19/2013  . Overweight(278.02) 12/19/2013  . Unspecified hypothyroidism 05/25/2014  . Skin lesion 06/15/2012    Dr Danella Deis     She has past surgical history that includes Abdominal hysterectomy (2002); Tonsillectomy (1967); titanium rod insertion (1978); titanium rod removal (1980); titanium rod removal (1981); Ganglion cyst excision (1987); and Bunionectomy (2004).   Her family history includes Cancer in her maternal grandmother, paternal grandfather, and paternal grandmother; Colon cancer in her paternal grandfather; Coronary artery disease in her father; Hyperlipidemia in her father; Hypertension in her father and mother; Hypothyroidism in her  sister; Irritable bowel syndrome in her mother; Obesity in her sister; Osteoarthritis in her mother; Osteoporosis in her mother; Pneumonia in her maternal grandmother; Scoliosis in her mother.She reports that she has never smoked. She has never used smokeless tobacco. She reports that she drinks alcohol. She reports that she does not use illicit drugs.  Current Outpatient Prescriptions on File Prior to Visit  Medication Sig Dispense Refill  . Ascorbic Acid (VITAMIN C PO) Take 1 tablet by mouth daily.    . NON FORMULARY Take 10 mg by mouth as needed. allerclear     . Probiotic Product (PROBIOTIC DAILY PO) Take 1 capsule by mouth daily.    . traZODone (DESYREL) 50 MG tablet Take 0.5-1 tablets (25-50 mg total) by mouth at bedtime as needed for sleep. 30 tablet 4  . TURMERIC PO Take 1 tablet by mouth daily.     No current facility-administered medications on file prior to visit.     Objective:  Objective Physical Exam  Constitutional: She is oriented to person, place, and time. She appears well-developed and well-nourished.  HENT:  Head: Normocephalic and atraumatic.  Eyes: Conjunctivae and EOM are normal.  Neck: Normal range of motion. Neck supple. No JVD present. Carotid bruit is not present. No thyromegaly present.  Cardiovascular: Normal rate, regular rhythm and normal heart sounds.   No murmur heard. Pulmonary/Chest: Effort normal and breath sounds normal. No respiratory distress. She has no wheezes. She has no rales. She exhibits no tenderness.  Musculoskeletal: She exhibits no edema.  Neurological: She is alert and oriented to person, place, and time.  Numbness starts in L eft armpit and goes down to 2-4 fingers.  Neck hurts --- massage helps  Psychiatric: She has a normal mood and affect. Her behavior is normal. Judgment and thought content normal.  Nursing note and vitals reviewed.  BP 100/58 mmHg  Pulse 78  Temp(Src) 99.5 F (37.5 C) (Oral)  Wt 150 lb 6.4 oz (68.221 kg)   SpO2 98% Wt Readings from Last 3 Encounters:  01/29/16 150 lb 6.4 oz (68.221 kg)  08/17/15 146 lb 4 oz (66.339 kg)  08/15/14 140 lb 12.8 oz (63.866 kg)     Lab Results  Component Value Date   WBC 7.5 08/17/2015   HGB 14.4 08/17/2015   HCT 42.7 08/17/2015   PLT 186.0 08/17/2015   GLUCOSE 98 08/17/2015   CHOL 200 08/17/2015   TRIG 74.0 08/17/2015   HDL 89.70 08/17/2015   LDLDIRECT 114.0 06/16/2013   LDLCALC 95 08/17/2015   ALT 28 08/17/2015   AST 27 08/17/2015   NA 140 08/17/2015   K 4.5 08/17/2015   CL 101 08/17/2015   CREATININE 0.65 08/17/2015   BUN 13 08/17/2015   CO2 29 08/17/2015   TSH 3.11 08/17/2015    [place] Abdomen Complete  01/15/2012  *RADIOLOGY REPORT* Clinical Data:  Nausea, epigastric pain, diarrhea COMPLETE ABDOMINAL ULTRASOUND Comparison:  None. Findings: Gallbladder:  No gallstones, gallbladder wall thickening, or pericholecystic fluid. No sonographic Murphy's sign. Common bile duct: Limited visualization due to bowel gas. Visualized CBD measures 1 mm in diameter. Liver:  No focal lesion identified. Mild increased echogenicity of the liver suspicious for mild hepatic fatty infiltration.  No intrahepatic biliary ductal dilatation. IVC:  Limited assessment due to abundant bowel gas. Pancreas: Limited assessment due to abundant bowel gas Spleen:  Measures 4.8 cm in length.  Normal echogenicity. Right Kidney:  Measures 11.7 cm in length.  No mass, hydronephrosis or diagnostic renal calculus Left Kidney:  Measures 9.8 cm in length.  No mass, hydronephrosis or diagnostic renal calculus Abdominal aorta:  No aneurysm identified. Limited visualization due to bowel gas.  Visualized aorta measures up to 2 cm in diameter. IMPRESSION: 1.  No gallstones are noted within gallbladder.  Normal CBD. 2.  Limited assessment of the pancreas and IVC due to abundant bowel gas. 3.  No hydronephrosis or diagnostic renal calculus. Original Report Authenticated By: Natasha MeadLIVIU POP, M.D.    Assessment &  Plan:  Plan I am having Ms. Packham start on cyclobenzaprine. I am also having her maintain her NON FORMULARY, traZODone, Probiotic Product (PROBIOTIC DAILY PO), TURMERIC PO, and Ascorbic Acid (VITAMIN C PO).  Meds ordered this encounter  Medications  . cyclobenzaprine (FLEXERIL) 10 MG tablet    Sig: Take 1 tablet (10 mg total) by mouth 3 (three) times daily as needed for muscle spasms.    Dispense:  30 tablet    Refill:  0    Problem List Items Addressed This Visit    None    Visit Diagnoses    Neck pain    -  Primary    Relevant Medications    cyclobenzaprine (FLEXERIL) 10 MG tablet    Other Relevant Orders    DG Cervical Spine Complete (Completed)     see avs  Follow-up: Return if symptoms worsen or fail to improve.  Donato SchultzYvonne R Lowne Chase, DO

## 2016-01-29 NOTE — Patient Instructions (Addendum)
Warm, moist compresses--- epson salt Aleve otc with muscle relaxer  Stretches as shown in office Deep tissue massage     Cervical Radiculopathy Cervical radiculopathy happens when a nerve in the neck (cervical nerve) is pinched or bruised. This condition can develop because of an injury or as part of the normal aging process. Pressure on the cervical nerves can cause pain or numbness that runs from the neck all the way down into the arm and fingers. Usually, this condition gets better with rest. Treatment may be needed if the condition does not improve.  CAUSES This condition may be caused by:  Injury.  Slipped (herniated) disk.  Muscle tightness in the neck because of overuse.  Arthritis.  Breakdown or degeneration in the bones and joints of the spine (spondylosis) due to aging.  Bone spurs that may develop near the cervical nerves. SYMPTOMS Symptoms of this condition include:  Pain that runs from the neck to the arm and hand. The pain can be severe or irritating. It may be worse when the neck is moved.  Numbness or weakness in the affected arm and hand. DIAGNOSIS This condition may be diagnosed based on symptoms, medical history, and a physical exam. You may also have tests, including:  X-rays.  CT scan.  MRI.  Electromyogram (EMG).  Nerve conduction tests. TREATMENT In many cases, treatment is not needed for this condition. With rest, the condition usually gets better over time. If treatment is needed, options may include:  Wearing a soft neck collar for short periods of time.  Physical therapy to strengthen your neck muscles.  Medicines, such as NSAIDs, oral corticosteroids, or spinal injections.  Surgery. This may be needed if other treatments do not help. Various types of surgery may be done depending on the cause of your problems. HOME CARE INSTRUCTIONS Managing Pain  Take over-the-counter and prescription medicines only as told by your health care  provider.  If directed, apply ice to the affected area.  Put ice in a plastic bag.  Place a towel between your skin and the bag.  Leave the ice on for 20 minutes, 2-3 times per day.  If ice does not help, you can try using heat. Take a warm shower or warm bath, or use a heat pack as told by your health care provider.  Try a gentle neck and shoulder massage to help relieve symptoms. Activity  Rest as needed. Follow instructions from your health care provider about any restrictions on activities.  Do stretching and strengthening exercises as told by your health care provider or physical therapist. General Instructions  If you were given a soft collar, wear it as told by your health care provider.  Use a flat pillow when you sleep.  Keep all follow-up visits as told by your health care provider. This is important. SEEK MEDICAL CARE IF:  Your condition does not improve with treatment. SEEK IMMEDIATE MEDICAL CARE IF:  Your pain gets much worse and cannot be controlled with medicines.  You have weakness or numbness in your hand, arm, face, or leg.  You have a high fever.  You have a stiff, rigid neck.  You lose control of your bowels or your bladder (have incontinence).  You have trouble with walking, balance, or speaking.   This information is not intended to replace advice given to you by your health care provider. Make sure you discuss any questions you have with your health care provider.   Document Released: 07/02/2001 Document Revised: 06/28/2015 Document  Reviewed: 12/01/2014 Elsevier Interactive Patient Education 2016 [employer] + ++

## 2016-01-29 NOTE — Progress Notes (Signed)
Pre visit review using our clinic review tool, if applicable. No additional management support is needed unless otherwise documented below in the visit note. 

## 2016-02-01 ENCOUNTER — Other Ambulatory Visit: Payer: Self-pay

## 2016-02-01 DIAGNOSIS — M542 Cervicalgia: Secondary | ICD-10-CM

## 2016-02-01 DIAGNOSIS — M5412 Radiculopathy, cervical region: Secondary | ICD-10-CM

## 2016-02-02 ENCOUNTER — Encounter: Payer: Self-pay | Admitting: Family Medicine

## 2016-02-12 ENCOUNTER — Ambulatory Visit (INDEPENDENT_AMBULATORY_CARE_PROVIDER_SITE_OTHER): Payer: 59

## 2016-02-12 ENCOUNTER — Other Ambulatory Visit: Payer: Self-pay | Admitting: Family Medicine

## 2016-02-12 DIAGNOSIS — M50223 Other cervical disc displacement at C6-C7 level: Secondary | ICD-10-CM

## 2016-02-12 DIAGNOSIS — M542 Cervicalgia: Secondary | ICD-10-CM

## 2016-02-12 DIAGNOSIS — M4692 Unspecified inflammatory spondylopathy, cervical region: Secondary | ICD-10-CM | POA: Diagnosis not present

## 2016-02-12 DIAGNOSIS — M5412 Radiculopathy, cervical region: Secondary | ICD-10-CM

## 2016-02-15 ENCOUNTER — Other Ambulatory Visit: Payer: Self-pay

## 2016-02-15 DIAGNOSIS — M5020 Other cervical disc displacement, unspecified cervical region: Secondary | ICD-10-CM

## 2016-08-22 ENCOUNTER — Ambulatory Visit (INDEPENDENT_AMBULATORY_CARE_PROVIDER_SITE_OTHER): Payer: 59 | Admitting: Family Medicine

## 2016-08-22 ENCOUNTER — Encounter: Payer: Self-pay | Admitting: Family Medicine

## 2016-08-22 VITALS — BP 120/78 | HR 69 | Temp 98.4°F | Ht 63.25 in | Wt 144.2 lb

## 2016-08-22 DIAGNOSIS — F32A Depression, unspecified: Secondary | ICD-10-CM

## 2016-08-22 DIAGNOSIS — F329 Major depressive disorder, single episode, unspecified: Secondary | ICD-10-CM

## 2016-08-22 DIAGNOSIS — R030 Elevated blood-pressure reading, without diagnosis of hypertension: Secondary | ICD-10-CM | POA: Diagnosis not present

## 2016-08-22 DIAGNOSIS — Z0000 Encounter for general adult medical examination without abnormal findings: Secondary | ICD-10-CM | POA: Diagnosis not present

## 2016-08-22 DIAGNOSIS — Z1239 Encounter for other screening for malignant neoplasm of breast: Secondary | ICD-10-CM

## 2016-08-22 DIAGNOSIS — G4700 Insomnia, unspecified: Secondary | ICD-10-CM | POA: Diagnosis not present

## 2016-08-22 DIAGNOSIS — F418 Other specified anxiety disorders: Secondary | ICD-10-CM

## 2016-08-22 DIAGNOSIS — R946 Abnormal results of thyroid function studies: Secondary | ICD-10-CM

## 2016-08-22 DIAGNOSIS — Z1231 Encounter for screening mammogram for malignant neoplasm of breast: Secondary | ICD-10-CM

## 2016-08-22 DIAGNOSIS — F419 Anxiety disorder, unspecified: Secondary | ICD-10-CM

## 2016-08-22 LAB — COMPREHENSIVE METABOLIC PANEL
ALK PHOS: 48 U/L (ref 39–117)
ALT: 30 U/L (ref 0–35)
AST: 29 U/L (ref 0–37)
Albumin: 4.7 g/dL (ref 3.5–5.2)
BUN: 13 mg/dL (ref 6–23)
CHLORIDE: 103 meq/L (ref 96–112)
CO2: 29 meq/L (ref 19–32)
Calcium: 10.3 mg/dL (ref 8.4–10.5)
Creatinine, Ser: 0.6 mg/dL (ref 0.40–1.20)
GFR: 109.65 mL/min (ref >60.00)
GLUCOSE: 108 mg/dL — AB (ref 70–99)
POTASSIUM: 4 meq/L (ref 3.5–5.1)
SODIUM: 140 meq/L (ref 135–145)
TOTAL PROTEIN: 7.5 g/dL (ref 6.0–8.3)
Total Bilirubin: 0.5 mg/dL (ref 0.2–1.2)

## 2016-08-22 LAB — LIPID PANEL
CHOL/HDL RATIO: 2
Cholesterol: 213 mg/dL — ABNORMAL HIGH (ref 0–200)
HDL: 101.5 mg/dL (ref >39.00)
LDL Cholesterol: 91 mg/dL (ref 0–99)
NONHDL: 111.24
TRIGLYCERIDES: 102 mg/dL (ref 0.0–149.0)
VLDL: 20.4 mg/dL (ref 0.0–40.0)

## 2016-08-22 LAB — CBC
HCT: 44 % (ref 36.0–46.0)
HEMOGLOBIN: 14.7 g/dL (ref 12.0–15.0)
MCHC: 33.5 g/dL (ref 30.0–36.0)
MCV: 94 fl (ref 78.0–100.0)
Platelets: 225 10*3/uL (ref 150.0–400.0)
RBC: 4.68 Mil/uL (ref 3.87–5.11)
RDW: 13 % (ref 11.5–15.5)
WBC: 6.3 10*3/uL (ref 4.0–10.5)

## 2016-08-22 LAB — TSH: TSH: 2.99 u[IU]/mL (ref 0.35–4.50)

## 2016-08-22 MED ORDER — TRAZODONE HCL 50 MG PO TABS: 25.0000 mg | ORAL_TABLET | Freq: Every evening | ORAL | 2 refills | Status: DC | PRN

## 2016-08-22 NOTE — Assessment & Plan Note (Signed)
Doing on probiotics and with dietary changes.

## 2016-08-22 NOTE — Progress Notes (Addendum)
Patient ID: Kristen Cooke, female   DOB: 08/19/1961, 55 y.o.   MRN: [id number]   Subjective:    Patient ID: Kristen Cooke, female    DOB: 01/13/1961, 55 y.o.   MRN: [id number]  Chief Complaint  Patient presents with  . Annual Exam    HPI Patient is in today for annual preventative exam and follow up on health conditions. She is very sad today because her father has just passed. 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 She feels she is managing well but endorses anhedonia. Marland Kitchensbh  Past Medical History:  Diagnosis Date  . Anxiety   . Anxiety and depression 01/21/2012  . Arthritis    back  . Chicken pox   . Depression   . Diarrhea 01/21/2012  . Diverticulosis 01/22/2012  . Elevated BP 12/19/2013  . Overweight(278.02) 12/19/2013  . 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 Reflux 01/21/2012  . Skin lesion 06/15/2012   Dr Danella Deis   . Skin lesion of left arm 06/15/2012  . Tinea corporis 09/20/2012  . Unspecified hypothyroidism 05/25/2014    Past Surgical History:  Procedure Laterality Date  . ABDOMINAL HYSTERECTOMY  2002   vaginal, partial  . BUNIONECTOMY  2004   b/l  . GANGLION CYST EXCISION  1987   right wrist  . titanium rod insertion  1978   right femur  . titanium rod removal  1980   bone graph, re-insertion  . titanium rod removal  1981  . TONSILLECTOMY  1967    Family History  Problem Relation Age of Onset  . Hypertension Father   . Hyperlipidemia Father   . Coronary artery disease Father   . Cancer Father     melanoma  . Hypertension Mother   . Osteoarthritis Mother   . Osteoporosis Mother   . Scoliosis Mother   . Irritable bowel syndrome Mother   . Cancer Maternal Grandmother     Breast  . Pneumonia Maternal Grandmother   . Cancer Paternal Grandmother   . Cancer Paternal Grandfather     colon  . Colon cancer Paternal Grandfather   . Hypothyroidism Sister   . Obesity Sister     Social History   Social History  . Marital status: Widowed   Spouse name: N/A  . Number of children: N/A  . Years of education: N/A   Occupational History  . Not on file.   Social History Main Topics  . Smoking status: Never Smoker  . Smokeless tobacco: Never Used  . Alcohol use 0.0 oz/week    7 - 14 Standard drinks or equivalent per week     Comment: 1-2 daily  . Drug use: No  . Sexual activity: Yes    Partners: Male   Other Topics Concern  . Not on file   Social History Narrative  . No narrative on file    Outpatient Medications Prior to Visit  Medication Sig Dispense Refill  . Ascorbic Acid (VITAMIN C PO) Take 1 tablet by mouth daily.    . NON FORMULARY Take 10 mg by mouth as needed. allerclear     . Probiotic Product (PROBIOTIC DAILY PO) Take 1 capsule by mouth daily.    . TURMERIC PO Take 1 tablet by mouth daily.    . traZODone (DESYREL) 50 MG tablet Take 0.5-1 tablets (25-50 mg total) by mouth at bedtime as needed for sleep. 30 tablet 4  . cyclobenzaprine (FLEXERIL) 10 MG tablet Take 1 tablet (  10 mg total) by mouth 3 (three) times daily as needed for muscle spasms. (Patient not taking: Reported on 08/22/2016) 30 tablet 0   No facility-administered medications prior to visit.     Allergies  Allergen Reactions  . Amoxicillin   . Sulfonamide Derivatives     Review of Systems  Constitutional: Negative for fever.  Eyes: Negative for blurred vision.  Respiratory: Negative for cough and shortness of breath.   Cardiovascular: Negative for chest pain and palpitations.  Gastrointestinal: Negative for vomiting.  Musculoskeletal: Negative for back pain.  Skin: Negative for rash.  Neurological: Negative for loss of consciousness and headaches.  Psychiatric/Behavioral: Positive for depression. The patient is nervous/anxious and has insomnia.        Objective:    Physical Exam  Constitutional: She is oriented to person, place, and time. She appears well-developed and well-nourished. No distress.  HENT:  Head: Normocephalic and  atraumatic.  Eyes: Conjunctivae are normal.  Neck: Normal range of motion. No thyromegaly present.  Cardiovascular: Normal rate and regular rhythm.   Pulmonary/Chest: Effort normal and breath sounds normal. She has no wheezes.  Abdominal: Soft. Bowel sounds are normal. There is no tenderness.  Musculoskeletal: Normal range of motion. She exhibits no edema or deformity.  Neurological: She is alert and oriented to person, place, and time.  Skin: Skin is warm and dry. She is not diaphoretic.  Psychiatric: She has a normal mood and affect.  Nursing note reviewed.   BP 120/78 (BP Location: Left Arm, Patient Position: Sitting, Cuff Size: Normal)   Pulse 69   Temp 98.4 F (36.9 C) (Oral)   Ht 5' 3.25" (1.607 m)   Wt 144 lb 3.2 oz (65.4 kg)   SpO2 99%   BMI 25.34 kg/m  Wt Readings from Last 3 Encounters:  09/19/16 147 lb (66.7 kg)  08/22/16 144 lb 3.2 oz (65.4 kg)  01/29/16 150 lb 6.4 oz (68.2 kg)     Lab Results  Component Value Date   WBC 6.3 08/22/2016   HGB 14.7 08/22/2016   HCT 44.0 08/22/2016   PLT 225.0 08/22/2016   GLUCOSE 108 (H) 08/22/2016   CHOL 213 (H) 08/22/2016   TRIG 102.0 08/22/2016   HDL 101.50 08/22/2016   LDLDIRECT 114.0 06/16/2013   LDLCALC 91 08/22/2016   ALT 30 08/22/2016   AST 29 08/22/2016   NA 140 08/22/2016   K 4.0 08/22/2016   CL 103 08/22/2016   CREATININE 0.60 08/22/2016   BUN 13 08/22/2016   CO2 29 08/22/2016   TSH 2.99 08/22/2016    Lab Results  Component Value Date   TSH 2.99 08/22/2016   Lab Results  Component Value Date   WBC 6.3 08/22/2016   HGB 14.7 08/22/2016   HCT 44.0 08/22/2016   MCV 94.0 08/22/2016   PLT 225.0 08/22/2016   Lab Results  Component Value Date   NA 140 08/22/2016   K 4.0 08/22/2016   CO2 29 08/22/2016   GLUCOSE 108 (H) 08/22/2016   BUN 13 08/22/2016   CREATININE 0.60 08/22/2016   BILITOT 0.5 08/22/2016   ALKPHOS 48 08/22/2016   AST 29 08/22/2016   ALT 30 08/22/2016   PROT 7.5 08/22/2016    ALBUMIN 4.7 08/22/2016   CALCIUM 10.3 08/22/2016   GFR 109.65 08/22/2016   Lab Results  Component Value Date   CHOL 213 (H) 08/22/2016   Lab Results  Component Value Date   HDL 101.50 08/22/2016   Lab Results  Component Value Date  LDLCALC 91 08/22/2016   Lab Results  Component Value Date   TRIG 102.0 08/22/2016   Lab Results  Component Value Date   CHOLHDL 2 08/22/2016   No results found for: HGBA1C     Assessment & Plan:   Problem List Items Addressed This Visit    Anxiety and depression    Very sad today due to her father's recent passing. 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      Preventative health care - Primary    Patient encouraged to maintain heart healthy diet, regular exercise, adequate sleep. Consider daily probiotics. Take medications as prescribed      Relevant Orders   TSH (Completed)   CBC (Completed)   Lipid panel (Completed)   Comprehensive metabolic panel (Completed)   Abnormal results of thyroid function studies    Recheck thyroid      Relevant Orders   TSH (Completed)   Elevated blood pressure, situational    Well controlled. Encouraged heart healthy diet such as the DASH diet and exercise as tolerated.       Relevant Orders   TSH (Completed)   CBC (Completed)   Lipid panel (Completed)   Comprehensive metabolic panel (Completed)    Other Visit Diagnoses    Insomnia, unspecified type       Relevant Medications   traZODone (DESYREL) 50 MG tablet   Breast cancer screening       Relevant Orders   MM Digital Screening (Completed)      I have discontinued Ms. Puopolo's cyclobenzaprine. I am also having her maintain her NON FORMULARY, Probiotic Product (PROBIOTIC DAILY PO), TURMERIC PO, Ascorbic Acid (VITAMIN C PO), and traZODone.  Meds ordered this encounter  Medications  . traZODone (DESYREL) 50 MG tablet    Sig: Take 0.5-1 tablets  (25-50 mg total) by mouth at bedtime as needed for sleep.    Dispense:  30 tablet    Refill:  2     Danise EdgeBLYTH, STACEY, MD

## 2016-08-22 NOTE — Patient Instructions (Addendum)
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 Preventive health guidelines for women include the following key practices.  A routine yearly physical is a good way to check with your health care provider about your health and preventive screening. It is a chance to share any concerns and updates on your health and to receive a thorough exam.  Visit your dentist for a routine exam and preventive care every 6 months. Brush your teeth twice a day and floss once a day. Good oral hygiene prevents tooth decay and gum disease.  The frequency of eye exams is based on your age, health, family medical history, use of contact lenses, and other factors. Follow your health care provider's recommendations for frequency of eye exams.  Eat a healthy diet. Foods like vegetables, fruits, whole grains, low-fat dairy products, and lean protein foods contain the nutrients you need without too many calories. Decrease your intake of foods high in solid fats, added sugars, and salt. Eat the right amount of calories for you.Get information about a proper diet from your health care provider, if necessary.  Regular physical exercise is one of the most important things you can do for your health. Most adults should get at least 150 minutes of moderate-intensity exercise (any activity that increases your heart rate and causes you to sweat) each week. In addition, most adults need muscle-strengthening exercises on 2 or more days a week.  Maintain a healthy weight. The body mass index (BMI) is a screening tool to identify possible weight problems. It provides an estimate of body fat based on height and weight. Your health care provider can find your BMI and can help you achieve or maintain a healthy weight.For adults 20 years  and older:  A BMI below 18.5 is considered underweight.  A BMI of 18.5 to 24.9 is normal.  A BMI of 25 to 29.9 is considered overweight.  A BMI of 30 and above is considered obese.  Maintain normal blood lipids and cholesterol levels by exercising and minimizing your intake of saturated fat. Eat a balanced diet with plenty of fruit and vegetables. Blood tests for lipids and cholesterol should begin at age 65 and be repeated every 5 years. If your lipid or cholesterol levels are high, you are over 50, or you are at high risk for heart disease, you may need your cholesterol levels checked more frequently.Ongoing high lipid and cholesterol levels should be treated with medicines if diet and exercise are not working.  If you smoke, find out from your health care provider how to quit. If you do not use tobacco, do not start.  Lung cancer screening is recommended for adults aged 21-80 years who are at high risk for developing lung cancer because of a history of smoking. A yearly low-dose CT scan of the lungs is recommended for people who have at least a 30-pack-year history of smoking and are a current smoker or have quit within the past 15 years. A pack year of smoking is smoking an average of 1 pack of cigarettes a day for 1 year (for example: 1 pack a day for 30 years or 2 packs a day for 15 years). Yearly screening should continue until the smoker has stopped smoking for at least 15 years. Yearly screening should be stopped for people who develop a health problem  that would prevent them from having lung cancer treatment.  If you are pregnant, do not drink alcohol. If you are breastfeeding, be very cautious about drinking alcohol. If you are not pregnant and choose to drink alcohol, do not have more than 1 drink per day. One drink is considered to be 12 ounces (355 mL) of beer, 5 ounces (148 mL) of wine, or 1.5 ounces (44 mL) of liquor.  Avoid use of street drugs. Do not share needles with anyone.  Ask for help if you need support or instructions about stopping the use of drugs.  High blood pressure causes heart disease and increases the risk of stroke. Your blood pressure should be checked at least every 1 to 2 years. Ongoing high blood pressure should be treated with medicines if weight loss and exercise do not work.  If you are 90-3 years old, ask your health care provider if you should take aspirin to prevent strokes.  Diabetes screening is done by taking a blood sample to check your blood glucose level after you have not eaten for a certain period of time (fasting). If you are not overweight and you do not have risk factors for diabetes, you should be screened once every 3 years starting at age 35. If you are overweight or obese and you are 77-75 years of age, you should be screened for diabetes every year as part of your cardiovascular risk assessment.  Breast cancer screening is essential preventive care for women. You should practice "breast self-awareness." This means understanding the normal appearance and feel of your breasts and may include breast self-examination. Any changes detected, no matter how small, should be reported to a health care provider. Women in their 36s and 30s should have a clinical breast exam (CBE) by a health care provider as part of a regular health exam every 1 to 3 years. After age 76, women should have a CBE every year. Starting at age 71, women should consider having a mammogram (breast X-ray test) every year. Women who have a family history of breast cancer should talk to their health care provider about genetic screening. Women at a high risk of breast cancer should talk to their health care providers about having an MRI and a mammogram every year.  Breast cancer gene (BRCA)-related cancer risk assessment is recommended for women who have family members with BRCA-related cancers. BRCA-related cancers include breast, ovarian, tubal, and peritoneal cancers.  Having family members with these cancers may be associated with an increased risk for harmful changes (mutations) in the breast cancer genes BRCA1 and BRCA2. Results of the assessment will determine the need for genetic counseling and BRCA1 and BRCA2 testing.  Your health care provider may recommend that you be screened regularly for cancer of the pelvic organs (ovaries, uterus, and vagina). This screening involves a pelvic examination, including checking for microscopic changes to the surface of your cervix (Pap test). You may be encouraged to have this screening done every 3 years, beginning at age 23.  For women ages 68-65, health care providers may recommend pelvic exams and Pap testing every 3 years, or they may recommend the Pap and pelvic exam, combined with testing for human papilloma virus (HPV), every 5 years. Some types of HPV increase your risk of cervical cancer. Testing for HPV may also be done on women of any age with unclear Pap test results.  Other health care providers may not recommend any screening for nonpregnant women who are considered low risk for  pelvic cancer and who do not have symptoms. Ask your health care provider if a screening pelvic exam is right for you.  If you have had past treatment for cervical cancer or a condition that could lead to cancer, you need Pap tests and screening for cancer for at least 20 years after your treatment. If Pap tests have been discontinued, your risk factors (such as having a new sexual partner) need to be reassessed to determine if screening should resume. Some women have medical problems that increase the chance of getting cervical cancer. In these cases, your health care provider may recommend more frequent screening and Pap tests.  Colorectal cancer can be detected and often prevented. Most routine colorectal cancer screening begins at the age of 66 years and continues through age 3 years. However, your health care provider may recommend  screening at an earlier age if you have risk factors for colon cancer. On a yearly basis, your health care provider may provide home test kits to check for hidden blood in the stool. Use of a small camera at the end of a tube, to directly examine the colon (sigmoidoscopy or colonoscopy), can detect the earliest forms of colorectal cancer. Talk to your health care provider about this at age 67, when routine screening begins. Direct exam of the colon should be repeated every 5-10 years through age 75 years, unless early forms of precancerous polyps or small growths are found.  People who are at an increased risk for hepatitis B should be screened for this virus. You are considered at high risk for hepatitis B if:  You were born in a country where hepatitis B occurs often. Talk with your health care provider about which countries are considered high risk.  Your parents were born in a high-risk country and you have not received a shot to protect against hepatitis B (hepatitis B vaccine).  You have HIV or AIDS.  You use needles to inject street drugs.  You live with, or have sex with, someone who has hepatitis B.  You get hemodialysis treatment.  You take certain medicines for conditions like cancer, organ transplantation, and autoimmune conditions.  Hepatitis C blood testing is recommended for all people born from [birth year] through 1965 and any individual with known risks for hepatitis C.  Practice safe sex. Use condoms and avoid high-risk sexual practices to reduce the spread of sexually transmitted infections (STIs). STIs include gonorrhea, chlamydia, syphilis, trichomonas, herpes, HPV, and human immunodeficiency virus (HIV). Herpes, HIV, and HPV are viral illnesses that have no cure. They can result in disability, cancer, and death.  You should be screened for sexually transmitted illnesses (STIs) including gonorrhea and chlamydia if:  You are sexually active and are younger than 24 years.  You  are older than 24 years and your health care provider tells you that you are at risk for this type of infection.  Your sexual activity has changed since you were last screened and you are at an increased risk for chlamydia or gonorrhea. Ask your health care provider if you are at risk.  If you are at risk of being infected with HIV, it is recommended that you take a prescription medicine daily to prevent HIV infection. This is called preexposure prophylaxis (PrEP). You are considered at risk if:  You are sexually active and do not regularly use condoms or know the HIV status of your partner(s).  You take drugs by injection.  You are sexually active with a partner who has  HIV.  Talk with your health care provider about whether you are at high risk of being infected with HIV. If you choose to begin PrEP, you should first be tested for HIV. You should then be tested every 3 months for as long as you are taking PrEP.  Osteoporosis is a disease in which the bones lose minerals and strength with aging. This can result in serious bone fractures or breaks. The risk of osteoporosis can be identified using a bone density scan. Women ages 84 years and over and women at risk for fractures or osteoporosis should discuss screening with their health care providers. Ask your health care provider whether you should take a calcium supplement or vitamin D to reduce the rate of osteoporosis.  Menopause can be associated with physical symptoms and risks. Hormone replacement therapy is available to decrease symptoms and risks. You should talk to your health care provider about whether hormone replacement therapy is right for you.  Use sunscreen. Apply sunscreen liberally and repeatedly throughout the day. You should seek shade when your shadow is shorter than you. Protect yourself by wearing long sleeves, pants, a wide-brimmed hat, and sunglasses year round, whenever you are outdoors.  Once a month, do a whole body  skin exam, using a mirror to look at the skin on your back. Tell your health care provider of new moles, moles that have irregular borders, moles that are larger than a pencil eraser, or moles that have changed in shape or color.  Stay current with required vaccines (immunizations).  Influenza vaccine. All adults should be immunized every year.  Tetanus, diphtheria, and acellular pertussis (Td, Tdap) vaccine. Pregnant women should receive 1 dose of Tdap vaccine during each pregnancy. The dose should be obtained regardless of the length of time since the last dose. Immunization is preferred during the 27th-36th week of gestation. An adult who has not previously received Tdap or who does not know her vaccine status should receive 1 dose of Tdap. This initial dose should be followed by tetanus and diphtheria toxoids (Td) booster doses every 10 years. Adults with an unknown or incomplete history of completing a 3-dose immunization series with Td-containing vaccines should begin or complete a primary immunization series including a Tdap dose. Adults should receive a Td booster every 10 years.  Varicella vaccine. An adult without evidence of immunity to varicella should receive 2 doses or a second dose if she has previously received 1 dose. Pregnant females who do not have evidence of immunity should receive the first dose after pregnancy. This first dose should be obtained before leaving the health care facility. The second dose should be obtained 4-8 weeks after the first dose.  Human papillomavirus (HPV) vaccine. Females aged 13-26 years who have not received the vaccine previously should obtain the 3-dose series. The vaccine is not recommended for use in pregnant females. However, pregnancy testing is not needed before receiving a dose. If a female is found to be pregnant after receiving a dose, no treatment is needed. In that case, the remaining doses should be delayed until after the pregnancy.  Immunization is recommended for any person with an immunocompromised condition through the age of 13 years if she did not get any or all doses earlier. During the 3-dose series, the second dose should be obtained 4-8 weeks after the first dose. The third dose should be obtained 24 weeks after the first dose and 16 weeks after the second dose.  Zoster vaccine. One dose is recommended  for adults aged 43 years or older unless certain conditions are present.  Measles, mumps, and rubella (MMR) vaccine. Adults born before [birth year] generally are considered immune to measles and mumps. Adults born in [birth year] or later should have 1 or more doses of MMR vaccine unless there is a contraindication to the vaccine or there is laboratory evidence of immunity to each of the three diseases. A routine second dose of MMR vaccine should be obtained at least 28 days after the first dose for students attending postsecondary schools, health care workers, or international travelers. People who received inactivated measles vaccine or an unknown type of measles vaccine during 1963-1967 should receive 2 doses of MMR vaccine. People who received inactivated mumps vaccine or an unknown type of mumps vaccine before 1979 and are at high risk for mumps infection should consider immunization with 2 doses of MMR vaccine. For females of childbearing age, rubella immunity should be determined. If there is no evidence of immunity, females who are not pregnant should be vaccinated. If there is no evidence of immunity, females who are pregnant should delay immunization until after pregnancy. Unvaccinated health care workers born before [birth year] who lack laboratory evidence of measles, mumps, or rubella immunity or laboratory confirmation of disease should consider measles and mumps immunization with 2 doses of MMR vaccine or rubella immunization with 1 dose of MMR vaccine.  Pneumococcal 13-valent conjugate (PCV13) vaccine. When indicated, a person who is  uncertain of his immunization history and has no record of immunization should receive the PCV13 vaccine. All adults 75 years of age and older should receive this vaccine. An adult aged 53 years or older who has certain medical conditions and has not been previously immunized should receive 1 dose of PCV13 vaccine. This PCV13 should be followed with a dose of pneumococcal polysaccharide (PPSV23) vaccine. Adults who are at high risk for pneumococcal disease should obtain the PPSV23 vaccine at least 8 weeks after the dose of PCV13 vaccine. Adults older than 56 years of age who have normal immune system function should obtain the PPSV23 vaccine dose at least 1 year after the dose of PCV13 vaccine.  Pneumococcal polysaccharide (PPSV23) vaccine. When PCV13 is also indicated, PCV13 should be obtained first. All adults aged 44 years and older should be immunized. An adult younger than age 49 years who has certain medical conditions should be immunized. Any person who resides in a nursing home or long-term care facility should be immunized. An adult smoker should be immunized. People with an immunocompromised condition and certain other conditions should receive both PCV13 and PPSV23 vaccines. People with human immunodeficiency virus (HIV) infection should be immunized as soon as possible after diagnosis. Immunization during chemotherapy or radiation therapy should be avoided. Routine use of PPSV23 vaccine is not recommended for American Indians, [place] Natives, or people younger than 65 years unless there are medical conditions that require PPSV23 vaccine. When indicated, people who have unknown immunization and have no record of immunization should receive PPSV23 vaccine. One-time revaccination 5 years after the first dose of PPSV23 is recommended for people aged 19-64 years who have chronic kidney failure, nephrotic syndrome, asplenia, or immunocompromised conditions. People who received 1-2 doses of PPSV23 before age  67 years should receive another dose of PPSV23 vaccine at age 57 years or later if at least 5 years have passed since the previous dose. Doses of PPSV23 are not needed for people immunized with PPSV23 at or after age 59 years.  Meningococcal vaccine. Adults with  asplenia or persistent complement component deficiencies should receive 2 doses of quadrivalent meningococcal conjugate (MenACWY-D) vaccine. The doses should be obtained at least 2 months apart. Microbiologists working with certain meningococcal bacteria, [place] recruits, people at risk during an outbreak, and people who travel to or live in countries with a high rate of meningitis should be immunized. A first-year college student up through age 13 years who is living in a residence hall should receive a dose if she did not receive a dose on or after her 16th birthday. Adults who have certain high-risk conditions should receive one or more doses of vaccine.  Hepatitis A vaccine. Adults who wish to be protected from this disease, have certain high-risk conditions, work with hepatitis A-infected animals, work in hepatitis A research labs, or travel to or work in countries with a high rate of hepatitis A should be immunized. Adults who were previously unvaccinated and who anticipate close contact with an international adoptee during the first 60 days after arrival in the [place] from a country with a high rate of hepatitis A should be immunized.  Hepatitis B vaccine. Adults who wish to be protected from this disease, have certain high-risk conditions, may be exposed to blood or other infectious body fluids, are household contacts or sex partners of hepatitis B positive people, are clients or workers in certain care facilities, or travel to or work in countries with a high rate of hepatitis B should be immunized.  Haemophilus influenzae type b (Hib) vaccine. A previously unvaccinated person with asplenia or sickle cell disease or having a  scheduled splenectomy should receive 1 dose of Hib vaccine. Regardless of previous immunization, a recipient of a hematopoietic stem cell transplant should receive a 3-dose series 6-12 months after her successful transplant. Hib vaccine is not recommended for adults with HIV infection. Preventive Services / Frequency Ages 37 to 78 years  Blood pressure check.** / Every 3-5 years.  Lipid and cholesterol check.** / Every 5 years beginning at age 99.  Clinical breast exam.** / Every 3 years for women in their 72s and 76s.  BRCA-related cancer risk assessment.** / For women who have family members with a BRCA-related cancer (breast, ovarian, tubal, or peritoneal cancers).  Pap test.** / Every 2 years from ages 57 through 53. Every 3 years starting at age 24 through age 5 or 57 with a history of 3 consecutive normal Pap tests.  HPV screening.** / Every 3 years from ages 61 through ages 61 to 42 with a history of 3 consecutive normal Pap tests.  Hepatitis C blood test.** / For any individual with known risks for hepatitis C.  Skin self-exam. / Monthly.  Influenza vaccine. / Every year.  Tetanus, diphtheria, and acellular pertussis (Tdap, Td) vaccine.** / Consult your health care provider. Pregnant women should receive 1 dose of Tdap vaccine during each pregnancy. 1 dose of Td every 10 years.  Varicella vaccine.** / Consult your health care provider. Pregnant females who do not have evidence of immunity should receive the first dose after pregnancy.  HPV vaccine. / 3 doses over 6 months, if 80 and younger. The vaccine is not recommended for use in pregnant females. However, pregnancy testing is not needed before receiving a dose.  Measles, mumps, rubella (MMR) vaccine.** / You need at least 1 dose of MMR if you were born in 1957 or later. You may also need a 2nd dose. For females of childbearing age, rubella immunity should be determined. If there is no  evidence of immunity, females who are not  pregnant should be vaccinated. If there is no evidence of immunity, females who are pregnant should delay immunization until after pregnancy.  Pneumococcal 13-valent conjugate (PCV13) vaccine.** / Consult your health care provider.  Pneumococcal polysaccharide (PPSV23) vaccine.** / 1 to 2 doses if you smoke cigarettes or if you have certain conditions.  Meningococcal vaccine.** / 1 dose if you are age 3 to 68 years and a Market researcher living in a residence hall, or have one of several medical conditions, you need to get vaccinated against meningococcal disease. You may also need additional booster doses.  Hepatitis A vaccine.** / Consult your health care provider.  Hepatitis B vaccine.** / Consult your health care provider.  Haemophilus influenzae type b (Hib) vaccine.** / Consult your health care provider. Ages 62 to 72 years  Blood pressure check.** / Every year.  Lipid and cholesterol check.** / Every 5 years beginning at age 73 years.  Lung cancer screening. / Every year if you are aged 88-80 years and have a 30-pack-year history of smoking and currently smoke or have quit within the past 15 years. Yearly screening is stopped once you have quit smoking for at least 15 years or develop a health problem that would prevent you from having lung cancer treatment.  Clinical breast exam.** / Every year after age 5 years.  BRCA-related cancer risk assessment.** / For women who have family members with a BRCA-related cancer (breast, ovarian, tubal, or peritoneal cancers).  Mammogram.** / Every year beginning at age 41 years and continuing for as long as you are in good health. Consult with your health care provider.  Pap test.** / Every 3 years starting at age 58 years through age 46 or 26 years with a history of 3 consecutive normal Pap tests.  HPV screening.** / Every 3 years from ages 64 years through ages 8 to 26 years with a history of 3 consecutive normal Pap  tests.  Fecal occult blood test (FOBT) of stool. / Every year beginning at age 48 years and continuing until age 28 years. You may not need to do this test if you get a colonoscopy every 10 years.  Flexible sigmoidoscopy or colonoscopy.** / Every 5 years for a flexible sigmoidoscopy or every 10 years for a colonoscopy beginning at age 5 years and continuing until age 35 years.  Hepatitis C blood test.** / For all people born from [birth year] through 1966 and any individual with known risks for hepatitis C.  Skin self-exam. / Monthly.  Influenza vaccine. / Every year.  Tetanus, diphtheria, and acellular pertussis (Tdap/Td) vaccine.** / Consult your health care provider. Pregnant women should receive 1 dose of Tdap vaccine during each pregnancy. 1 dose of Td every 10 years.  Varicella vaccine.** / Consult your health care provider. Pregnant females who do not have evidence of immunity should receive the first dose after pregnancy.  Zoster vaccine.** / 1 dose for adults aged 31 years or older.  Measles, mumps, rubella (MMR) vaccine.** / You need at least 1 dose of MMR if you were born in 1957 or later. You may also need a second dose. For females of childbearing age, rubella immunity should be determined. If there is no evidence of immunity, females who are not pregnant should be vaccinated. If there is no evidence of immunity, females who are pregnant should delay immunization until after pregnancy.  Pneumococcal 13-valent conjugate (PCV13) vaccine.** / Consult your health care provider.  Pneumococcal polysaccharide (  PPSV23) vaccine.** / 1 to 2 doses if you smoke cigarettes or if you have certain conditions.  Meningococcal vaccine.** / Consult your health care provider.  Hepatitis A vaccine.** / Consult your health care provider.  Hepatitis B vaccine.** / Consult your health care provider.  Haemophilus influenzae type b (Hib) vaccine.** / Consult your health care provider. Ages 66 years and  over  Blood pressure check.** / Every year.  Lipid and cholesterol check.** / Every 5 years beginning at age 54 years.  Lung cancer screening. / Every year if you are aged 71-80 years and have a 30-pack-year history of smoking and currently smoke or have quit within the past 15 years. Yearly screening is stopped once you have quit smoking for at least 15 years or develop a health problem that would prevent you from having lung cancer treatment.  Clinical breast exam.** / Every year after age 36 years.  BRCA-related cancer risk assessment.** / For women who have family members with a BRCA-related cancer (breast, ovarian, tubal, or peritoneal cancers).  Mammogram.** / Every year beginning at age 6 years and continuing for as long as you are in good health. Consult with your health care provider.  Pap test.** / Every 3 years starting at age 5 years through age 29 or 42 years with 3 consecutive normal Pap tests. Testing can be stopped between 65 and 70 years with 3 consecutive normal Pap tests and no abnormal Pap or HPV tests in the past 10 years.  HPV screening.** / Every 3 years from ages 75 years through ages 72 or 32 years with a history of 3 consecutive normal Pap tests. Testing can be stopped between 65 and 70 years with 3 consecutive normal Pap tests and no abnormal Pap or HPV tests in the past 10 years.  Fecal occult blood test (FOBT) of stool. / Every year beginning at age 58 years and continuing until age 78 years. You may not need to do this test if you get a colonoscopy every 10 years.  Flexible sigmoidoscopy or colonoscopy.** / Every 5 years for a flexible sigmoidoscopy or every 10 years for a colonoscopy beginning at age 51 years and continuing until age 61 years.  Hepatitis C blood test.** / For all people born from [birth year] through 1965 and any individual with known risks for hepatitis C.  Osteoporosis screening.** / A one-time screening for women ages 30 years and over and women  at risk for fractures or osteoporosis.  Skin self-exam. / Monthly.  Influenza vaccine. / Every year.  Tetanus, diphtheria, and acellular pertussis (Tdap/Td) vaccine.** / 1 dose of Td every 10 years.  Varicella vaccine.** / Consult your health care provider.  Zoster vaccine.** / 1 dose for adults aged 51 years or older.  Pneumococcal 13-valent conjugate (PCV13) vaccine.** / Consult your health care provider.  Pneumococcal polysaccharide (PPSV23) vaccine.** / 1 dose for all adults aged 79 years and older.  Meningococcal vaccine.** / Consult your health care provider.  Hepatitis A vaccine.** / Consult your health care provider.  Hepatitis B vaccine.** / Consult your health care provider.  Haemophilus influenzae type b (Hib) vaccine.** / Consult your health care provider. ** Family history and personal history of risk and conditions may change your health care provider's recommendations.   This information is not intended to replace advice given to you by your health care provider. Make sure you discuss any questions you have with your health care provider.   Document Released: 12/03/2001 Document Revised: 10/28/2014 Document  Reviewed: 03/04/2011 Elsevier Interactive Patient Education [employer].

## 2016-08-22 NOTE — Assessment & Plan Note (Signed)
Well controlled. Encouraged heart healthy diet such as the DASH diet and exercise as tolerated.  

## 2016-08-22 NOTE — Assessment & Plan Note (Signed)
Recheck thyroid

## 2016-08-22 NOTE — Progress Notes (Signed)
Pre visit review using our clinic review tool, if applicable. No additional management support is needed unless otherwise documented below in the visit note. 

## 2016-08-25 NOTE — Assessment & Plan Note (Signed)
Very sad today due to her father's recent passing. 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

## 2016-08-25 NOTE — Assessment & Plan Note (Signed)
Patient encouraged to maintain heart healthy diet, regular exercise, adequate sleep. Consider daily probiotics. Take medications as prescribed 

## 2016-08-27 ENCOUNTER — Ambulatory Visit (HOSPITAL_BASED_OUTPATIENT_CLINIC_OR_DEPARTMENT_OTHER)
Admission: RE | Admit: 2016-08-27 | Discharge: 2016-08-27 | Disposition: A | Discharge status: Home / Self Care | Payer: 59 | Source: Ambulatory Visit | Attending: Family Medicine | Admitting: Family Medicine

## 2016-08-27 DIAGNOSIS — Z1231 Encounter for screening mammogram for malignant neoplasm of breast: Secondary | ICD-10-CM | POA: Diagnosis not present

## 2016-08-27 DIAGNOSIS — Z1239 Encounter for other screening for malignant neoplasm of breast: Secondary | ICD-10-CM

## 2016-09-19 ENCOUNTER — Ambulatory Visit (INDEPENDENT_AMBULATORY_CARE_PROVIDER_SITE_OTHER): Payer: 59 | Admitting: Medical

## 2016-09-19 ENCOUNTER — Encounter: Payer: Self-pay | Admitting: Medical

## 2016-09-19 VITALS — BP 118/78 | Temp 98.3°F | Ht 63.25 in | Wt 147.0 lb

## 2016-09-19 DIAGNOSIS — R05 Cough: Secondary | ICD-10-CM

## 2016-09-19 DIAGNOSIS — J069 Acute upper respiratory infection, unspecified: Secondary | ICD-10-CM

## 2016-09-19 DIAGNOSIS — R062 Wheezing: Secondary | ICD-10-CM

## 2016-09-19 DIAGNOSIS — R059 Cough, unspecified: Secondary | ICD-10-CM

## 2016-09-19 MED ORDER — AZELASTINE HCL 0.1 % NA SOLN: 2.0000 | Freq: Two times a day (BID) | NASAL | 3 refills | Status: DC

## 2016-09-19 MED ORDER — ALBUTEROL SULFATE HFA 108 (90 BASE) MCG/ACT IN AERS: 2.0000 | INHALATION_SPRAY | Freq: Four times a day (QID) | RESPIRATORY_TRACT | 0 refills | Status: DC | PRN

## 2016-09-19 MED ORDER — BENZONATATE 100 MG PO CAPS: 100.0000 mg | ORAL_CAPSULE | Freq: Three times a day (TID) | ORAL | 0 refills | Status: DC | PRN

## 2016-09-19 MED ORDER — AZITHROMYCIN 250 MG PO TABS: ORAL_TABLET | ORAL | 0 refills | Status: DC

## 2016-09-19 NOTE — Progress Notes (Addendum)
Subjective:    Patient ID: Kristen Cooke, female    DOB: June 02, 1961, 55 y.o.   MRN: [id number]  HPI  Pt in sick for 3 weeks. Pt has had cough intermittently. Pt states most dry cough. Rare productive. No fevers,no chills or sweats. Mild nasal congestion. Pt has pnd.  Pt has some mild wheezing. NO hx of asthma. Cough not worse at night.  Pt tried some mucinex and vit c.   Pt denies hx of chronic bronchitis. Some hx of year round allergies. Has steroid nasal spray.  Review of Systems  Constitutional: Negative for chills, fatigue and fever.  HENT: Positive for congestion, postnasal drip and sinus pressure. Negative for sneezing, sore throat and trouble swallowing.   Respiratory: Positive for cough. Negative for shortness of breath and wheezing.   Cardiovascular: Negative for chest pain and palpitations.  Gastrointestinal: Negative for abdominal distention.  Musculoskeletal: Negative for back pain.  Neurological: Negative for dizziness and headaches.  Hematological: Negative for adenopathy. Does not bruise/bleed easily.  Psychiatric/Behavioral: Negative for behavioral problems and confusion.    Past Medical History:  Diagnosis Date  . Anxiety   . Anxiety and depression 01/21/2012  . Arthritis    back  . Chicken pox   . Depression   . Diarrhea 01/21/2012  . Diverticulosis 01/22/2012  . Elevated BP 12/19/2013  . Overweight(278.02) 12/19/2013  . 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 Reflux 01/21/2012  . Skin lesion 06/15/2012   Dr Danella Deis   . Skin lesion of left arm 06/15/2012  . Tinea corporis 09/20/2012  . Unspecified hypothyroidism 05/25/2014     Social History   Social History  . Marital status: Widowed    Spouse name: N/A  . Number of children: N/A  . Years of education: N/A   Occupational History  . Not on file.   Social History Main Topics  . Smoking status: Never Smoker  . Smokeless tobacco: Never Used  . Alcohol use 0.0 oz/week    7 - 14 Standard drinks or equivalent  per week     Comment: 1-2 daily  . Drug use: No  . Sexual activity: Yes    Partners: Male   Other Topics Concern  . Not on file   Social History Narrative  . No narrative on file    Past Surgical History:  Procedure Laterality Date  . ABDOMINAL HYSTERECTOMY  2002   vaginal, partial  . BUNIONECTOMY  2004   b/l  . GANGLION CYST EXCISION  1987   right wrist  . titanium rod insertion  1978   right femur  . titanium rod removal  1980   bone graph, re-insertion  . titanium rod removal  1981  . TONSILLECTOMY  1967    Family History  Problem Relation Age of Onset  . Hypertension Father   . Hyperlipidemia Father   . Coronary artery disease Father   . Cancer Father     melanoma  . Hypertension Mother   . Osteoarthritis Mother   . Osteoporosis Mother   . Scoliosis Mother   . Irritable bowel syndrome Mother   . Cancer Maternal Grandmother     Breast  . Pneumonia Maternal Grandmother   . Cancer Paternal Grandmother   . Cancer Paternal Grandfather     colon  . Colon cancer Paternal Grandfather   . Hypothyroidism Sister   . Obesity Sister     Allergies  Allergen Reactions  . Amoxicillin   . Sulfonamide Derivatives  Current Outpatient Prescriptions on File Prior to Visit  Medication Sig Dispense Refill  . Ascorbic Acid (VITAMIN C PO) Take 1 tablet by mouth daily.    . NON FORMULARY Take 10 mg by mouth as needed. allerclear     . Probiotic Product (PROBIOTIC DAILY PO) Take 1 capsule by mouth daily.    . traZODone (DESYREL) 50 MG tablet Take 0.5-1 tablets (25-50 mg total) by mouth at bedtime as needed for sleep. 30 tablet 2  . TURMERIC PO Take 1 tablet by mouth daily.     No current facility-administered medications on file prior to visit.     BP 118/78 (BP Location: Left Arm, Patient Position: Sitting, Cuff Size: Normal)   Temp 98.3 F (36.8 C) (Oral)   Ht 5' 3.25" (1.607 m)   Wt 147 lb (66.7 kg)   BMI 25.83 kg/m      Objective:   Physical  Exam  General  Mental Status - Alert. General Appearance - Well groomed. Not in acute distress.  Skin Rashes- No Rashes.  HEENT Head- Normal. Ear Auditory Canal - Left- Normal. Right - Normal.Tympanic Membrane- Left- Normal. Right- Normal. Eye Sclera/Conjunctiva- Left- Normal. Right- Normal. Nose & Sinuses Nasal Mucosa- Left-  Boggy and Congested. Right-  Boggy and  Congested.Bilateral maxillary and frontal sinus pressure. Mouth & Throat Lips: Upper Lip- Normal: no dryness, cracking, pallor, cyanosis, or vesicular eruption. Lower Lip-Normal: no dryness, cracking, pallor, cyanosis or vesicular eruption. Buccal Mucosa- Bilateral- No Aphthous ulcers. Oropharynx- No Discharge or Erythema. Tonsils: Characteristics- Bilateral- No Erythema or Congestion. Size/Enlargement- Bilateral- No enlargement. Discharge- bilateral-None.  Neck Neck- Supple. No Masses.   Chest and Lung Exam Auscultation: Breath Sounds:-Clear even and unlabored.  Cardiovascular Auscultation:Rythm- Regular, rate and rhythm. Murmurs & Other Heart Sounds:Ausculatation of the heart reveal- No Murmurs.  Lymphatic Head & Neck General Head & Neck Lymphatics: Bilateral: Description- No Localized lymphadenopathy.       Assessment & Plan:  You appear to have uri vs allergy flare for 3 weeks. Some concern for bronchitis with prolonged illness.  Will add continue steroid nasal spray. Will add astelin.  For cough wil rx benzonatate.  If cough does not taper offer by Monday then start azithromycin and get cxr.  For wheezing will rx abluterol  Follow up in 7 days or as needed  Josselyn Harkins, Ramon Dredge, [employer]

## 2016-09-19 NOTE — Progress Notes (Signed)
Pre visit review using our clinic review tool, if applicable. No additional management support is needed unless otherwise documented below in the visit note. 

## 2016-09-19 NOTE — Patient Instructions (Addendum)
You appear to have uri vs allergy flare for 3 weeks. Some concern for bronchitis with prolonged illness.  Will add continue steroid nasal spray. Will add astelin.  For cough wil rx benzonatate.  If cough does not taper offer by Monday then start azithromycin and get cxr.  For wheezing will rx albuterol  Follow up in 7 days or as needed

## 2016-09-25 ENCOUNTER — Other Ambulatory Visit: Payer: Self-pay

## 2017-05-12 DIAGNOSIS — H5213 Myopia, bilateral: Secondary | ICD-10-CM | POA: Diagnosis not present

## 2017-06-03 ENCOUNTER — Encounter: Payer: Self-pay | Admitting: Family Medicine

## 2017-07-07 DIAGNOSIS — L578 Other skin changes due to chronic exposure to nonionizing radiation: Secondary | ICD-10-CM | POA: Diagnosis not present

## 2017-08-25 ENCOUNTER — Ambulatory Visit (INDEPENDENT_AMBULATORY_CARE_PROVIDER_SITE_OTHER): Payer: 59 | Admitting: Family Medicine

## 2017-08-25 ENCOUNTER — Encounter: Payer: Self-pay | Admitting: Family Medicine

## 2017-08-25 VITALS — BP 128/62 | HR 79 | Temp 98.6°F | Resp 18 | Ht 63.0 in | Wt 144.4 lb

## 2017-08-25 DIAGNOSIS — Z1231 Encounter for screening mammogram for malignant neoplasm of breast: Secondary | ICD-10-CM | POA: Diagnosis not present

## 2017-08-25 DIAGNOSIS — Z1239 Encounter for other screening for malignant neoplasm of breast: Secondary | ICD-10-CM

## 2017-08-25 DIAGNOSIS — F329 Major depressive disorder, single episode, unspecified: Secondary | ICD-10-CM

## 2017-08-25 DIAGNOSIS — Z0000 Encounter for general adult medical examination without abnormal findings: Secondary | ICD-10-CM | POA: Diagnosis not present

## 2017-08-25 DIAGNOSIS — E039 Hypothyroidism, unspecified: Secondary | ICD-10-CM

## 2017-08-25 DIAGNOSIS — F32A Depression, unspecified: Secondary | ICD-10-CM

## 2017-08-25 DIAGNOSIS — F419 Anxiety disorder, unspecified: Secondary | ICD-10-CM | POA: Diagnosis not present

## 2017-08-25 LAB — COMPREHENSIVE METABOLIC PANEL
ALT: 16 U/L (ref 0–35)
AST: 17 U/L (ref 0–37)
Albumin: 4.7 g/dL (ref 3.5–5.2)
Alkaline Phosphatase: 49 U/L (ref 39–117)
BILIRUBIN TOTAL: 0.6 mg/dL (ref 0.2–1.2)
BUN: 15 mg/dL (ref 6–23)
CALCIUM: 10.3 mg/dL (ref 8.4–10.5)
CO2: 30 mEq/L (ref 19–32)
CREATININE: 0.65 mg/dL (ref 0.40–1.20)
Chloride: 101 mEq/L (ref 96–112)
GFR: 99.62 mL/min (ref >60.00)
GLUCOSE: 111 mg/dL — AB (ref 70–99)
Potassium: 4.1 mEq/L (ref 3.5–5.1)
Sodium: 139 mEq/L (ref 135–145)
TOTAL PROTEIN: 7 g/dL (ref 6.0–8.3)

## 2017-08-25 LAB — CBC
HCT: 43.3 % (ref 36.0–46.0)
HEMOGLOBIN: 14.4 g/dL (ref 12.0–15.0)
MCHC: 33.4 g/dL (ref 30.0–36.0)
MCV: 94.6 fl (ref 78.0–100.0)
PLATELETS: 196 10*3/uL (ref 150.0–400.0)
RBC: 4.57 Mil/uL (ref 3.87–5.11)
RDW: 13 % (ref 11.5–15.5)
WBC: 6.4 10*3/uL (ref 4.0–10.5)

## 2017-08-25 LAB — LIPID PANEL
CHOLESTEROL: 192 mg/dL (ref 0–200)
HDL: 84.1 mg/dL (ref >39.00)
LDL Cholesterol: 92 mg/dL (ref 0–99)
NonHDL: 107.54
TRIGLYCERIDES: 79 mg/dL (ref 0.0–149.0)
Total CHOL/HDL Ratio: 2
VLDL: 15.8 mg/dL (ref 0.0–40.0)

## 2017-08-25 LAB — TSH: TSH: 3.88 u[IU]/mL (ref 0.35–4.50)

## 2017-08-25 NOTE — Progress Notes (Signed)
Subjective:  I acted as a Neurosurgeon for Dr. Abner Greenspan. [place], [place]  Patient ID: COPPER KIRTLEY, female    DOB: Dec 05, 1960, 57 y.o.   MRN: [id number]  No chief complaint on file.   HPI  Patient is in today for an annual exam. She currently has no acute concerns today. No recent febrile illness or acute hospitalizations. Denies CP/palp/SOB/HA/congestion/fevers/GI or GU c/o. Taking meds as prescribed. 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 Has started some counseling to help her manage it with good results. Notes some anhedonia but does not endorse suicidal ideation. She is trying to eat well and take care of herself.    Patient Care Team: Bradd Canary, MD as PCP - General Lesle Reek, DDS as Consulting Physician (Dental General Practice) Ilda Mori, MD as Consulting Physician (Obstetrics and Gynecology)   Past Medical History:  Diagnosis Date  . Anxiety   . Anxiety and depression 01/21/2012  . Arthritis    back  . Chicken pox   . Depression   . Diarrhea 01/21/2012  . Diverticulosis 01/22/2012  . Elevated BP 12/19/2013  . Overweight(278.02) 12/19/2013  . 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 Reflux 01/21/2012  . Skin lesion 06/15/2012   Dr Danella Deis   . Skin lesion of left arm 06/15/2012  . Tinea corporis 09/20/2012  . Unspecified hypothyroidism 05/25/2014    Past Surgical History:  Procedure Laterality Date  . ABDOMINAL HYSTERECTOMY  2002   vaginal, partial  . BUNIONECTOMY  2004   b/l  . GANGLION CYST EXCISION  1987   right wrist  . titanium rod insertion  1978   right femur  . titanium rod removal  1980   bone graph, re-insertion  . titanium rod removal  1981  . TONSILLECTOMY  1967    Family History  Problem Relation Age of Onset  . Hypertension Father   . Hyperlipidemia Father   . Coronary artery disease Father   . Cancer Father        melanoma  . Hypertension Mother   . Osteoarthritis Mother   . Osteoporosis  Mother   . Scoliosis Mother   . Irritable bowel syndrome Mother   . Cancer Maternal Grandmother        Breast  . Pneumonia Maternal Grandmother   . Cancer Paternal Grandmother   . Cancer Paternal Grandfather        colon  . Colon cancer Paternal Grandfather   . Hypothyroidism Sister   . Obesity Sister     Social History   Socioeconomic History  . Marital status: Widowed    Spouse name: Not on file  . Number of children: 0  . Years of education: Not on file  . Highest education level: Not on file  Social Needs  . Financial resource strain: Not on file  . Food insecurity - worry: Not on file  . Food insecurity - inability: Not on file  . Transportation needs - medical: No  . Transportation needs - non-medical: No  Occupational History  . Not on file  Tobacco Use  . Smoking status: Never Smoker  . Smokeless tobacco: Never Used  Substance and Sexual Activity  . Alcohol use: Yes    Alcohol/week: 0.0 oz    Types: 7 - 14 Standard drinks or equivalent per week    Comment: 1-2 daily  . Drug use: No  . Sexual activity: Yes    Partners: Male  Other Topics Concern  . Not on file  Social History Narrative  . Not on file    Outpatient Medications Prior to Visit  Medication Sig Dispense Refill  . Ascorbic Acid (VITAMIN C) 1000 MG tablet Take 1,000 mg daily by mouth.    . NON FORMULARY Take 10 mg by mouth as needed. allerclear     . OVER THE COUNTER MEDICATION     . Probiotic Product (PROBIOTIC DAILY PO) Take 1 capsule by mouth daily.    . TURMERIC PO Take 1 tablet by mouth daily.    . Ascorbic Acid (VITAMIN C PO) Take 1 tablet by mouth daily.    Marland Kitchen albuterol (PROVENTIL HFA;VENTOLIN HFA) 108 (90 Base) MCG/ACT inhaler Inhale 2 puffs into the lungs every 6 (six) hours as needed for wheezing or shortness of breath. 1 Inhaler 0  . azelastine (ASTELIN) 0.1 % nasal spray Place 2 sprays into both nostrils 2 (two) times daily. Use in each nostril as directed 30 mL 3  . azithromycin  (ZITHROMAX) 250 MG tablet Take 2 tablets by mouth on day 1, followed by 1 tablet by mouth daily for 4 days. 6 tablet 0  . benzonatate (TESSALON) 100 MG capsule Take 1 capsule (100 mg total) by mouth 3 (three) times daily as needed for cough. 21 capsule 0  . traZODone (DESYREL) 50 MG tablet Take 0.5-1 tablets (25-50 mg total) by mouth at bedtime as needed for sleep. 30 tablet 2   No facility-administered medications prior to visit.     Allergies  Allergen Reactions  . Nsaids   . Sulfa Antibiotics   . Amoxicillin   . Sulfonamide Derivatives     Review of Systems  Constitutional: Negative for fever and malaise/fatigue.  HENT: Negative for congestion.   Eyes: Negative for blurred vision.  Respiratory: Negative for cough and shortness of breath.   Cardiovascular: Negative for palpitations and leg swelling.  Gastrointestinal: Negative for vomiting.  Musculoskeletal: Negative for back pain.  Skin: Negative for rash.  Neurological: Negative for loss of consciousness and headaches.  Psychiatric/Behavioral: Positive for depression. The patient is nervous/anxious.        Objective:    Physical Exam  Constitutional: She is oriented to person, place, and time. She appears well-developed and well-nourished. No distress.  HENT:  Head: Normocephalic and atraumatic.  Eyes: Conjunctivae are normal.  Neck: Normal range of motion. No thyromegaly present.  Cardiovascular: Normal rate and regular rhythm.  Pulmonary/Chest: Effort normal and breath sounds normal. She has no wheezes.  Abdominal: Soft. Bowel sounds are normal. There is no tenderness.  Musculoskeletal: Normal range of motion. She exhibits no edema or deformity.  Neurological: She is alert and oriented to person, place, and time.  Skin: Skin is warm and dry. She is not diaphoretic.  Psychiatric: She has a normal mood and affect.    BP 128/62 (BP Location: Left Arm, Patient Position: Sitting, Cuff Size: Normal)   Pulse 79   Temp  98.6 F (37 C) (Oral)   Resp 18   Ht 5\' 3"  (1.6 m)   Wt 144 lb 6.4 oz (65.5 kg)   SpO2 99%   BMI 25.58 kg/m  Wt Readings from Last 3 Encounters:  08/25/17 144 lb 6.4 oz (65.5 kg)  09/19/16 147 lb (66.7 kg)  08/22/16 144 lb 3.2 oz (65.4 kg)   BP Readings from Last 3 Encounters:  08/25/17 128/62  09/19/16 118/78  08/22/16 120/78     Immunization History  Administered Date(s) Administered  .  Tdap 06/15/2012    Health Maintenance  Topic Date Due  . Hepatitis C Screening  September 02, 1961  . HIV Screening  12/21/1974  . PAP SMEAR  12/19/2016  . MAMMOGRAM  08/27/2018  . COLONOSCOPY  10/01/2021  . TETANUS/TDAP  06/15/2022  . INFLUENZA VACCINE  Discontinued    Lab Results  Component Value Date   WBC 6.4 08/25/2017   HGB 14.4 08/25/2017   HCT 43.3 08/25/2017   PLT 196.0 08/25/2017   GLUCOSE 111 (H) 08/25/2017   CHOL 192 08/25/2017   TRIG 79.0 08/25/2017   HDL 84.10 08/25/2017   LDLDIRECT 114.0 06/16/2013   LDLCALC 92 08/25/2017   ALT 16 08/25/2017   AST 17 08/25/2017   NA 139 08/25/2017   K 4.1 08/25/2017   CL 101 08/25/2017   CREATININE 0.65 08/25/2017   BUN 15 08/25/2017   CO2 30 08/25/2017   TSH 3.88 08/25/2017    Lab Results  Component Value Date   TSH 3.88 08/25/2017   Lab Results  Component Value Date   WBC 6.4 08/25/2017   HGB 14.4 08/25/2017   HCT 43.3 08/25/2017   MCV 94.6 08/25/2017   PLT 196.0 08/25/2017   Lab Results  Component Value Date   NA 139 08/25/2017   K 4.1 08/25/2017   CO2 30 08/25/2017   GLUCOSE 111 (H) 08/25/2017   BUN 15 08/25/2017   CREATININE 0.65 08/25/2017   BILITOT 0.6 08/25/2017   ALKPHOS 49 08/25/2017   AST 17 08/25/2017   ALT 16 08/25/2017   PROT 7.0 08/25/2017   ALBUMIN 4.7 08/25/2017   CALCIUM 10.3 08/25/2017   GFR 99.62 08/25/2017   Lab Results  Component Value Date   CHOL 192 08/25/2017   Lab Results  Component Value Date   HDL 84.10 08/25/2017   Lab Results  Component Value Date   LDLCALC 92  08/25/2017   Lab Results  Component Value Date   TRIG 79.0 08/25/2017   Lab Results  Component Value Date   CHOLHDL 2 08/25/2017   No results found for: HGBA1C       Assessment & Plan:   Problem List Items Addressed This Visit    Anxiety and depression    Is doing counseling at [facility] and is dong well despite being the full time caregiver of her mother.      Preventative health care - Primary    Patient encouraged to maintain heart healthy diet, regular exercise, adequate sleep. Consider daily probiotics. Take medications as prescribed. Labs reviewed      Relevant Orders   CBC (Completed)   Comprehensive metabolic panel (Completed)   Lipid panel (Completed)   TSH (Completed)   Hypothyroidism    Check thyroid studies       Other Visit Diagnoses    Breast cancer screening       Relevant Orders   MM DIAG BREAST TOMO BILATERAL      I have discontinued Claudina LickLeslie R. Correira's Ascorbic Acid (VITAMIN C PO), traZODone, azelastine, benzonatate, azithromycin, and albuterol. I am also having her maintain her NON FORMULARY, Probiotic Product (PROBIOTIC DAILY PO), TURMERIC PO, OVER THE COUNTER MEDICATION, and vitamin C.  Meds ordered this encounter  Medications  . OVER THE COUNTER MEDICATION  . Ascorbic Acid (VITAMIN C) 1000 MG tablet    Sig: Take 1,000 mg daily by mouth.    CMA served as Neurosurgeonscribe during this visit. History, Physical and Plan performed by medical provider. Documentation and orders reviewed and attested to.  Penni Homans, MD

## 2017-08-25 NOTE — Assessment & Plan Note (Signed)
Check thyroid studies 

## 2017-08-25 NOTE — Assessment & Plan Note (Signed)
Is doing counseling at [facility] and is dong well despite being the full time caregiver of her mother.

## 2017-08-25 NOTE — Patient Instructions (Signed)
Preventive Care 40-64 Years, Female Preventive care refers to lifestyle choices and visits with your health care provider that can promote health and wellness. What does preventive care include?  A yearly physical exam. This is also called an annual well check.  Dental exams once or twice a year.  Routine eye exams. Ask your health care provider how often you should have your eyes checked.  Personal lifestyle choices, including: ? Daily care of your teeth and gums. ? Regular physical activity. ? Eating a healthy diet. ? Avoiding tobacco and drug use. ? Limiting alcohol use. ? Practicing safe sex. ? Taking low-dose aspirin daily starting at age 57. ? Taking vitamin and mineral supplements as recommended by your health care provider. What happens during an annual well check? The services and screenings done by your health care provider during your annual well check will depend on your age, overall health, lifestyle risk factors, and family history of disease. Counseling Your health care provider may ask you questions about your:  Alcohol use.  Tobacco use.  Drug use.  Emotional well-being.  Home and relationship well-being.  Sexual activity.  Eating habits.  Work and work Statistician.  Method of birth control.  Menstrual cycle.  Pregnancy history.  Screening You may have the following tests or measurements:  Height, weight, and BMI.  Blood pressure.  Lipid and cholesterol levels. These may be checked every 5 years, or more frequently if you are over 57 years old.  Skin check.  Lung cancer screening. You may have this screening every year starting at age 57 if you have a 30-pack-year history of smoking and currently smoke or have quit within the past 15 years.  Fecal occult blood test (FOBT) of the stool. You may have this test every year starting at age 57.  Flexible sigmoidoscopy or colonoscopy. You may have a sigmoidoscopy every 5 years or a colonoscopy  every 10 years starting at age 57.  Hepatitis C blood test.  Hepatitis B blood test.  Sexually transmitted disease (STD) testing.  Diabetes screening. This is done by checking your blood sugar (glucose) after you have not eaten for a while (fasting). You may have this done every 1-3 years.  Mammogram. This may be done every 1-2 years. Talk to your health care provider about when you should start having regular mammograms. This may depend on whether you have a family history of breast cancer.  BRCA-related cancer screening. This may be done if you have a family history of breast, ovarian, tubal, or peritoneal cancers.  Pelvic exam and Pap test. This may be done every 3 years starting at age 57. Starting at age 36, this may be done every 5 years if you have a Pap test in combination with an HPV test.  Bone density scan. This is done to screen for osteoporosis. You may have this scan if you are at high risk for osteoporosis.  Discuss your test results, treatment options, and if necessary, the need for more tests with your health care provider. Vaccines Your health care provider may recommend certain vaccines, such as:  Influenza vaccine. This is recommended every year.  Tetanus, diphtheria, and acellular pertussis (Tdap, Td) vaccine. You may need a Td booster every 10 years.  Varicella vaccine. You may need this if you have not been vaccinated.  Zoster vaccine. You may need this after age 5.  Measles, mumps, and rubella (MMR) vaccine. You may need at least one dose of MMR if you were born in  1958 or later. You may also need a second dose.  Pneumococcal 13-valent conjugate (PCV13) vaccine. You may need this if you have certain conditions and were not previously vaccinated.  Pneumococcal polysaccharide (PPSV23) vaccine. You may need one or two doses if you smoke cigarettes or if you have certain conditions.  Meningococcal vaccine. You may need this if you have certain  conditions.  Hepatitis A vaccine. You may need this if you have certain conditions or if you travel or work in places where you may be exposed to hepatitis A.  Hepatitis B vaccine. You may need this if you have certain conditions or if you travel or work in places where you may be exposed to hepatitis B.  Haemophilus influenzae type b (Hib) vaccine. You may need this if you have certain conditions.  Talk to your health care provider about which screenings and vaccines you need and how often you need them. This information is not intended to replace advice given to you by your health care provider. Make sure you discuss any questions you have with your health care provider. Document Released: 11/03/2015 Document Revised: 06/26/2016 Document Reviewed: 08/08/2015 Elsevier Interactive Patient Education  2017 [employer].

## 2017-08-25 NOTE — Assessment & Plan Note (Signed)
Patient encouraged to maintain heart healthy diet, regular exercise, adequate sleep. Consider daily probiotics. Take medications as prescribed. Labs reviewed 

## 2017-08-26 ENCOUNTER — Other Ambulatory Visit (INDEPENDENT_AMBULATORY_CARE_PROVIDER_SITE_OTHER): Payer: 59

## 2017-08-26 DIAGNOSIS — R739 Hyperglycemia, unspecified: Secondary | ICD-10-CM

## 2017-08-26 LAB — HEMOGLOBIN A1C: Hgb A1c MFr Bld: 5.5 % (ref 4.6–6.5)

## 2017-09-17 ENCOUNTER — Telehealth: Payer: Self-pay

## 2017-09-17 DIAGNOSIS — Z1231 Encounter for screening mammogram for malignant neoplasm of breast: Secondary | ICD-10-CM

## 2017-09-17 NOTE — Telephone Encounter (Signed)
Order placed

## 2017-09-17 NOTE — Telephone Encounter (Signed)
Copied from CRM [id number]. Topic: Referral - Request >> Sep 17, 2017  7:14 AM Waymon AmatoBurton, Donna F wrote: Reason for CRM: pt is requesting an appt for a mammogram   Best number [phone]

## 2017-09-22 ENCOUNTER — Ambulatory Visit (HOSPITAL_BASED_OUTPATIENT_CLINIC_OR_DEPARTMENT_OTHER): Payer: 59

## 2017-09-23 ENCOUNTER — Ambulatory Visit (HOSPITAL_BASED_OUTPATIENT_CLINIC_OR_DEPARTMENT_OTHER)
Admission: RE | Admit: 2017-09-23 | Discharge: 2017-09-23 | Disposition: A | Discharge status: Home / Self Care | Payer: 59 | Source: Ambulatory Visit | Attending: Family Medicine | Admitting: Family Medicine

## 2017-09-23 DIAGNOSIS — Z1231 Encounter for screening mammogram for malignant neoplasm of breast: Secondary | ICD-10-CM | POA: Insufficient documentation

## 2018-05-14 DIAGNOSIS — H2513 Age-related nuclear cataract, bilateral: Secondary | ICD-10-CM | POA: Diagnosis not present

## 2018-07-16 DIAGNOSIS — R002 Palpitations: Secondary | ICD-10-CM | POA: Diagnosis not present

## 2018-07-16 DIAGNOSIS — R030 Elevated blood-pressure reading, without diagnosis of hypertension: Secondary | ICD-10-CM | POA: Diagnosis not present

## 2018-07-24 DIAGNOSIS — R002 Palpitations: Secondary | ICD-10-CM | POA: Diagnosis not present

## 2018-07-30 ENCOUNTER — Telehealth: Payer: Self-pay | Admitting: *Deleted

## 2018-07-30 NOTE — Telephone Encounter (Signed)
Received request for Medical Records from [facility]; forwarded to [facility] for email/scan/SLS 10/10

## 2018-08-27 ENCOUNTER — Encounter: Payer: 59 | Admitting: Family Medicine

## 2019-11-25 ENCOUNTER — Other Ambulatory Visit: Payer: Self-pay | Admitting: Family Medicine

## 2019-11-25 DIAGNOSIS — Z1239 Encounter for other screening for malignant neoplasm of breast: Secondary | ICD-10-CM

## 2019-12-02 ENCOUNTER — Ambulatory Visit: Payer: 59

## 2019-12-02 ENCOUNTER — Ambulatory Visit (INDEPENDENT_AMBULATORY_CARE_PROVIDER_SITE_OTHER): Payer: 59

## 2019-12-02 ENCOUNTER — Other Ambulatory Visit: Payer: Self-pay

## 2019-12-02 DIAGNOSIS — Z1239 Encounter for other screening for malignant neoplasm of breast: Secondary | ICD-10-CM | POA: Diagnosis not present

## 2021-10-01 ENCOUNTER — Encounter: Payer: Self-pay | Admitting: Gastroenterology
